# Patient Record
Sex: Female | Born: 1955
Health system: Southern US, Community
[De-identification: ages and names within clinical notes are randomized; demographics above are authoritative.]

## PROBLEM LIST (undated history)

## (undated) DIAGNOSIS — I10 Essential (primary) hypertension: Secondary | ICD-10-CM

## (undated) DIAGNOSIS — F419 Anxiety disorder, unspecified: Secondary | ICD-10-CM

## (undated) DIAGNOSIS — M797 Fibromyalgia: Secondary | ICD-10-CM

## (undated) DIAGNOSIS — M3313 Other dermatomyositis without myopathy: Secondary | ICD-10-CM

## (undated) DIAGNOSIS — R51 Headache: Secondary | ICD-10-CM

## (undated) DIAGNOSIS — E785 Hyperlipidemia, unspecified: Secondary | ICD-10-CM

## (undated) DIAGNOSIS — M1990 Unspecified osteoarthritis, unspecified site: Secondary | ICD-10-CM

## (undated) DIAGNOSIS — M3390 Dermatopolymyositis, unspecified, organ involvement unspecified: Secondary | ICD-10-CM

## (undated) DIAGNOSIS — M3500 Sicca syndrome, unspecified: Secondary | ICD-10-CM

## (undated) DIAGNOSIS — Z720 Tobacco use: Secondary | ICD-10-CM

## (undated) DIAGNOSIS — N6019 Diffuse cystic mastopathy of unspecified breast: Secondary | ICD-10-CM

## (undated) DIAGNOSIS — R011 Cardiac murmur, unspecified: Secondary | ICD-10-CM

## (undated) HISTORY — DX: Anxiety disorder, unspecified: F41.9

## (undated) HISTORY — PX: OTHER SURGICAL HISTORY: SHX169

## (undated) HISTORY — PX: OOPHORECTOMY: SHX86

## (undated) HISTORY — PX: TONSILLECTOMY: SUR1361

## (undated) HISTORY — DX: Tobacco use: Z72.0

## (undated) HISTORY — DX: Fibromyalgia: M79.7

## (undated) HISTORY — PX: ABDOMINAL HYSTERECTOMY: SHX81

## (undated) HISTORY — PX: TUBAL LIGATION: SHX77

## (undated) HISTORY — DX: Diffuse cystic mastopathy of unspecified breast: N60.19

## (undated) HISTORY — DX: Hyperlipidemia, unspecified: E78.5

## (undated) HISTORY — PX: APPENDECTOMY: SHX54

---

## 2004-02-17 ENCOUNTER — Ambulatory Visit: Payer: Self-pay | Admitting: Unknown Physician Specialty

## 2004-06-22 ENCOUNTER — Ambulatory Visit: Payer: Self-pay | Admitting: Unknown Physician Specialty

## 2004-08-14 ENCOUNTER — Encounter: Admission: RE | Admit: 2004-08-14 | Discharge: 2004-08-14 | Payer: Self-pay | Admitting: Sports Medicine

## 2005-01-09 ENCOUNTER — Ambulatory Visit: Payer: Self-pay | Admitting: Internal Medicine

## 2005-04-17 ENCOUNTER — Ambulatory Visit: Payer: Self-pay | Admitting: Unknown Physician Specialty

## 2005-11-07 ENCOUNTER — Ambulatory Visit: Payer: Self-pay | Admitting: Internal Medicine

## 2006-03-15 ENCOUNTER — Ambulatory Visit: Payer: Self-pay | Admitting: Internal Medicine

## 2006-10-24 ENCOUNTER — Telehealth: Payer: Self-pay | Admitting: Internal Medicine

## 2007-01-21 ENCOUNTER — Ambulatory Visit: Payer: Self-pay | Admitting: Internal Medicine

## 2007-03-12 ENCOUNTER — Ambulatory Visit: Payer: Self-pay | Admitting: Internal Medicine

## 2008-04-23 ENCOUNTER — Ambulatory Visit: Payer: Self-pay | Admitting: Internal Medicine

## 2009-04-07 ENCOUNTER — Ambulatory Visit: Payer: Self-pay | Admitting: Internal Medicine

## 2010-03-12 ENCOUNTER — Encounter: Payer: Self-pay | Admitting: Sports Medicine

## 2010-06-01 ENCOUNTER — Ambulatory Visit: Payer: Self-pay | Admitting: Internal Medicine

## 2010-07-04 LAB — HM MAMMOGRAPHY: HM Mammogram: NORMAL

## 2010-11-10 ENCOUNTER — Other Ambulatory Visit: Payer: Self-pay | Admitting: Internal Medicine

## 2010-11-11 MED ORDER — DIAZEPAM 5 MG PO TABS: 5.0000 mg | ORAL_TABLET | Freq: Two times a day (BID) | ORAL | Status: DC | PRN

## 2010-12-22 ENCOUNTER — Telehealth: Payer: Self-pay | Admitting: Internal Medicine

## 2010-12-22 MED ORDER — AZITHROMYCIN 500 MG PO TABS: 500.0000 mg | ORAL_TABLET | Freq: Every day | ORAL | Status: AC

## 2010-12-22 MED ORDER — BENZONATATE 200 MG PO CAPS: 200.0000 mg | ORAL_CAPSULE | Freq: Four times a day (QID) | ORAL | Status: AC | PRN

## 2010-12-22 NOTE — Telephone Encounter (Signed)
Plesae call her in tessalon 200 mg one tablet every 6 hours prn cough .#60. Also azithroymcing 500 mg one tablet daily  #7 no refills.  Please alsk urge her to make appt and get records transferred.   If she asks for something stronger, my policy is she will  need to be seen.

## 2010-12-22 NOTE — Telephone Encounter (Signed)
[phone] Pt called wanted to know if you could call her something in cough,sore throat,glands swollen [facility] [id number]  [phone]  Patient has not signed medical release form

## 2010-12-22 NOTE — Telephone Encounter (Signed)
Both Rxs have been made and she made an appt, and will get her records transferred prior to the appt.

## 2011-01-03 ENCOUNTER — Ambulatory Visit (INDEPENDENT_AMBULATORY_CARE_PROVIDER_SITE_OTHER): Payer: 59 | Admitting: Internal Medicine

## 2011-01-03 ENCOUNTER — Encounter: Payer: Self-pay | Admitting: Internal Medicine

## 2011-01-03 VITALS — BP 156/70 | HR 70 | Temp 98.3°F | Resp 16 | Ht 65.0 in | Wt 138.5 lb

## 2011-01-03 DIAGNOSIS — Z7189 Other specified counseling: Secondary | ICD-10-CM

## 2011-01-03 DIAGNOSIS — J449 Chronic obstructive pulmonary disease, unspecified: Secondary | ICD-10-CM

## 2011-01-03 DIAGNOSIS — G4440 Drug-induced headache, not elsewhere classified, not intractable: Secondary | ICD-10-CM

## 2011-01-03 DIAGNOSIS — Z720 Tobacco use: Secondary | ICD-10-CM

## 2011-01-03 DIAGNOSIS — M797 Fibromyalgia: Secondary | ICD-10-CM

## 2011-01-03 DIAGNOSIS — IMO0001 Reserved for inherently not codable concepts without codable children: Secondary | ICD-10-CM

## 2011-01-03 DIAGNOSIS — R030 Elevated blood-pressure reading, without diagnosis of hypertension: Secondary | ICD-10-CM

## 2011-01-03 DIAGNOSIS — Z716 Tobacco abuse counseling: Secondary | ICD-10-CM

## 2011-01-03 DIAGNOSIS — R05 Cough: Secondary | ICD-10-CM

## 2011-01-03 DIAGNOSIS — F17200 Nicotine dependence, unspecified, uncomplicated: Secondary | ICD-10-CM

## 2011-01-03 DIAGNOSIS — F411 Generalized anxiety disorder: Secondary | ICD-10-CM

## 2011-01-03 DIAGNOSIS — F419 Anxiety disorder, unspecified: Secondary | ICD-10-CM

## 2011-01-03 MED ORDER — HYDROCODONE-ACETAMINOPHEN 7.5-500 MG PO TABS: 1.0000 | ORAL_TABLET | Freq: Four times a day (QID) | ORAL | Status: AC | PRN

## 2011-01-03 MED ORDER — ALPRAZOLAM 0.5 MG PO TABS: 0.5000 mg | ORAL_TABLET | Freq: Two times a day (BID) | ORAL | Status: DC | PRN

## 2011-01-03 NOTE — Progress Notes (Signed)
  Subjective:    Patient ID: Katrina Martinez, female    DOB: 14-Dec-1955, 55 y.o.   MRN: [id number]  HPI   55 yo white female history of chronic bronchitis, ongoing tobacco abuse,  Lifelong, presents  with 2.5 wk history of otitis and productive cough.  She was treated with  a 7 day course of antibiotics and tessalon for cough (tussionex was requested but refused given the ongoing tobacco use and frequent requests for cough supression) .  Finished the antibiotics last week .  Today she is reporting pain under her left scapula aggravated by  Cough and twisting of torso.  The pain is severe and she reports that the cough is not relieved with tessalon because it caused nausea.  She has not stopped smoking despite numerous discussions over the past two years about the risks.  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      Review of Systems  HENT: Positive for postnasal drip and sinus pressure.   Respiratory: Positive for cough and chest tightness.   Neurological: Positive for headaches.  Psychiatric/Behavioral: Positive for dysphoric mood. The patient is nervous/anxious.   All other systems reviewed and are negative.       Objective:   Physical Exam  Constitutional: She is oriented to person, place, and time. Vital signs are normal. She appears well-developed and well-nourished.  HENT:  Mouth/Throat: Oropharynx is clear and moist.  Eyes: EOM are normal. Pupils are equal, round, and reactive to light. No scleral icterus.  Neck: Normal range of motion. Neck supple. No JVD present. No thyromegaly present.  Cardiovascular: Normal rate, regular rhythm, normal heart sounds and intact distal pulses.   Pulmonary/Chest: Effort normal and breath sounds normal.  Abdominal: Soft. Bowel sounds are normal. She exhibits no mass.  There is no tenderness.  Musculoskeletal: Normal range of motion. She exhibits no edema.  Lymphadenopathy:    She has no cervical adenopathy.  Neurological: She is alert and oriented to person, place, and time.  Skin: Skin is warm and dry.  Psychiatric: She has a normal mood and affect.          Assessment & Plan:  Bronchitis:  Due to persistent posterior chest pain, 2 view was done to rule out PNA,  CXR was normal. Continue symptomatic treatment and tobacco cessation again discussed and advised.

## 2011-01-04 ENCOUNTER — Ambulatory Visit (INDEPENDENT_AMBULATORY_CARE_PROVIDER_SITE_OTHER)
Admission: RE | Admit: 2011-01-04 | Discharge: 2011-01-04 | Disposition: A | Discharge status: Home / Self Care | Payer: 59 | Source: Ambulatory Visit | Attending: Internal Medicine | Admitting: Internal Medicine

## 2011-01-04 DIAGNOSIS — R05 Cough: Secondary | ICD-10-CM

## 2011-01-04 DIAGNOSIS — R059 Cough, unspecified: Secondary | ICD-10-CM

## 2011-01-06 ENCOUNTER — Encounter: Payer: Self-pay | Admitting: Internal Medicine

## 2011-01-06 DIAGNOSIS — F419 Anxiety disorder, unspecified: Secondary | ICD-10-CM | POA: Insufficient documentation

## 2011-01-06 DIAGNOSIS — Z87891 Personal history of nicotine dependence: Secondary | ICD-10-CM | POA: Insufficient documentation

## 2011-01-06 DIAGNOSIS — N6019 Diffuse cystic mastopathy of unspecified breast: Secondary | ICD-10-CM | POA: Insufficient documentation

## 2011-01-06 DIAGNOSIS — J449 Chronic obstructive pulmonary disease, unspecified: Secondary | ICD-10-CM | POA: Insufficient documentation

## 2011-01-06 DIAGNOSIS — M797 Fibromyalgia: Secondary | ICD-10-CM | POA: Insufficient documentation

## 2011-01-06 DIAGNOSIS — Z716 Tobacco abuse counseling: Secondary | ICD-10-CM | POA: Insufficient documentation

## 2011-01-06 DIAGNOSIS — G4440 Drug-induced headache, not elsewhere classified, not intractable: Secondary | ICD-10-CM | POA: Insufficient documentation

## 2011-01-06 NOTE — Patient Instructions (Signed)
Please consider tobacco cessation to reduce your occurrences of respiratory infections and your risks of lung and breast cancer.   If the electric cigarette does not help ,  We will discuss a trial of wellbutrin at next visit.

## 2011-01-06 NOTE — Assessment & Plan Note (Signed)
She has been normotensive upon reviw of last year of office notes.  Will recheck in one month

## 2011-01-06 NOTE — Assessment & Plan Note (Signed)
She was inquiring about the electric cigarette , which I encouraged her to try, since she did not try Chantix in May after another visit for sinusitis/bronchitis.  If she is still smoking in one month will advise trial of wellbutrin. 5 minutes spent discussing her tobacco dependence and her general health decline due to the effects of continued tobacco abuse.

## 2011-01-06 NOTE — Assessment & Plan Note (Signed)
Chronic.  In April I initiated trial of lexapro , which she stated did not help her anxiety.  She continues to prefer benzodiazepines to SSRIs.  We will discuss  tirial of wellbutrin at next visit for dual purpsoe of tobacco cessation and depression/anxiety

## 2011-01-19 ENCOUNTER — Other Ambulatory Visit: Payer: Self-pay | Admitting: Internal Medicine

## 2011-01-23 ENCOUNTER — Telehealth: Payer: Self-pay | Admitting: Internal Medicine

## 2011-01-23 MED ORDER — HYDROCODONE-ACETAMINOPHEN 5-325 MG PO TABS: 1.0000 | ORAL_TABLET | ORAL | Status: AC | PRN

## 2011-01-23 NOTE — Telephone Encounter (Signed)
I will refill the hydrodocone one more time but if she is continuing to need it after this sh e will need to see a pulmonologist. I will be happy to refer her . Qty #  40 no refills.

## 2011-01-23 NOTE — Telephone Encounter (Signed)
[phone]  Pt has appoinment on 02/02/11 and she is out of her cough med genric for hydrocodine  Pt stated she is doing better with not smoking [facility] [employer].

## 2011-01-23 NOTE — Telephone Encounter (Signed)
Can patient have a refill, it is not in her chart.  Please advise.

## 2011-01-24 NOTE — Telephone Encounter (Signed)
Patient notified, Rx has been called in.  She stated she will let us know if she needs the referral because she is still coughing.

## 2011-02-02 ENCOUNTER — Encounter: Payer: Self-pay | Admitting: Internal Medicine

## 2011-02-02 ENCOUNTER — Ambulatory Visit (INDEPENDENT_AMBULATORY_CARE_PROVIDER_SITE_OTHER): Payer: 59 | Admitting: Internal Medicine

## 2011-02-02 DIAGNOSIS — Z720 Tobacco use: Secondary | ICD-10-CM

## 2011-02-02 DIAGNOSIS — F419 Anxiety disorder, unspecified: Secondary | ICD-10-CM

## 2011-02-02 DIAGNOSIS — J449 Chronic obstructive pulmonary disease, unspecified: Secondary | ICD-10-CM

## 2011-02-02 DIAGNOSIS — R030 Elevated blood-pressure reading, without diagnosis of hypertension: Secondary | ICD-10-CM

## 2011-02-02 DIAGNOSIS — L299 Pruritus, unspecified: Secondary | ICD-10-CM

## 2011-02-02 DIAGNOSIS — F411 Generalized anxiety disorder: Secondary | ICD-10-CM

## 2011-02-02 DIAGNOSIS — R05 Cough: Secondary | ICD-10-CM

## 2011-02-02 DIAGNOSIS — F17200 Nicotine dependence, unspecified, uncomplicated: Secondary | ICD-10-CM

## 2011-02-02 MED ORDER — HYDROXYZINE PAMOATE 100 MG PO CAPS: 100.0000 mg | ORAL_CAPSULE | Freq: Three times a day (TID) | ORAL | Status: AC | PRN

## 2011-02-02 MED ORDER — CICLESONIDE 50 MCG/ACT NA SUSP: 2.0000 | Freq: Every day | NASAL | Status: DC

## 2011-02-02 NOTE — Progress Notes (Signed)
  Subjective:    Patient ID: Katrina Martinez, female    DOB: 1955/08/31, 55 y.o.   MRN: [id number]  HPI  Ms Bachand returns for a  one month followup from COPD exacerbation.  She continues to smoke but has reduced her use to one pack per month.  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  She is not using a sterid inhaler or bronchodilator regularly and had apparently normal PFTS one or two years ago.    Past Medical History  Diagnosis Date  . Tobacco abuse   . Hyperlipidemia   . Fibrocystic breast disease in female     annual mammograms Feb 2012 , [place]  . Anxiety   . Fibromyalgia     did not tolerate Cymbalta trial (per records)   Current Outpatient Prescriptions on File Prior to Visit  Medication Sig Dispense Refill  . ALPRAZolam (XANAX) 0.5 MG tablet Take 1 tablet (0.5 mg total) by mouth 2 (two) times daily as needed for sleep or anxiety.  60 tablet  3  . diazepam (VALIUM) 5 MG tablet Take 1 tablet (5 mg total) by mouth 2 (two) times daily as needed for anxiety or sleep.  30 tablet  2  . fexofenadine (ALLEGRA) 180 MG tablet Take 180 mg by mouth daily.        Marland Kitchen HYDROcodone-acetaminophen (NORCO) 5-325 MG per tablet Take 1 tablet by mouth every 4 (four) hours as needed (cough).  40 tablet  0     Review of Systems  Constitutional: Negative for fever, chills and unexpected weight change.  HENT: Positive for postnasal drip. Negative for hearing loss, ear pain, nosebleeds, congestion, sore throat, facial swelling, rhinorrhea, sneezing, mouth sores, trouble swallowing, neck pain, neck stiffness, voice change, sinus pressure, tinnitus and ear discharge.   Eyes: Negative for pain, discharge, redness and visual disturbance.  Respiratory: Positive for cough. Negative for chest tightness, shortness of breath, wheezing and stridor.   Cardiovascular: Negative for chest pain, palpitations and leg  swelling.  Musculoskeletal: Negative for myalgias and arthralgias.  Skin: Negative for color change and rash.  Neurological: Negative for dizziness, weakness, light-headedness and headaches.  Hematological: Negative for adenopathy.  Psychiatric/Behavioral: Negative for dysphoric mood. The patient is nervous/anxious.   All other systems reviewed and are negative.       Objective:   Physical Exam  Constitutional: She is oriented to person, place, and time. She appears well-developed and well-nourished.  HENT:  Mouth/Throat: Oropharynx is clear and moist.  Eyes: EOM are normal. Pupils are equal, round, and reactive to light. No scleral icterus.  Neck: Normal range of motion. Neck supple. No JVD present. No thyromegaly present.  Cardiovascular: Normal rate, regular rhythm, normal heart sounds and intact distal pulses.   Pulmonary/Chest: Effort normal and breath sounds normal.  Abdominal: Soft. Bowel sounds are normal. She exhibits no mass. There is no tenderness.  Musculoskeletal: Normal range of motion. She exhibits no edema.  Lymphadenopathy:    She has no cervical adenopathy.  Neurological: She is alert and oriented to person, place, and time.  Skin: Skin is warm and dry.  Psychiatric: She has a normal mood and affect.          Assessment & Plan:

## 2011-02-02 NOTE — Patient Instructions (Signed)
For your nighttime cough,  please use omnaris spray daily (2 squirts)  And add OTC prilosec for reflux.  You can use vicodin for a week to overlap.  If the cough recurs despite using daily omnaris and prilosec,  Call and we will refer your to ENT.

## 2011-02-03 ENCOUNTER — Encounter: Payer: Self-pay | Admitting: Internal Medicine

## 2011-02-03 NOTE — Assessment & Plan Note (Signed)
Since late adolescence.  She has mild COPD by PFTS done December 2011 and frequent sinus infections and episodes of bronchitis and has finally reduced her consumption.  She does not want pharmacotherapy at this time.

## 2011-02-03 NOTE — Assessment & Plan Note (Signed)
Repeat bp today is normal.

## 2011-02-03 NOTE — Assessment & Plan Note (Addendum)
Secondary to ongoing tobacco abuse.  PFTS were done Dec 2011 and noted mild disease.  Marland Kitchen Appropriate medications have been prescribed but she does not use them regularly and has requested refills of vicodin regularly to treat nighttime cough.  This was addressed today and she was provide samples of omnari ris to add to her bronchodilator therapy.  If symptoms persistetn will refer to ENT and pulmonology.

## 2011-02-03 NOTE — Assessment & Plan Note (Signed)
Discussed the need for SSRI therapy if her use of  Benzodiazepines becomes daily.  She states that she is not using valium or alprazolam on a daily basis. Will consider Zyban at next visit if refill history suggests otherwise.

## 2011-03-13 ENCOUNTER — Telehealth: Payer: Self-pay | Admitting: Internal Medicine

## 2011-03-13 DIAGNOSIS — R05 Cough: Secondary | ICD-10-CM

## 2011-03-13 MED ORDER — BENZONATATE 200 MG PO CAPS: 200.0000 mg | ORAL_CAPSULE | Freq: Three times a day (TID) | ORAL | Status: DC | PRN

## 2011-03-13 MED ORDER — HYDROCODONE-ACETAMINOPHEN 5-500 MG PO TABS: 1.0000 | ORAL_TABLET | Freq: Three times a day (TID) | ORAL | Status: AC | PRN

## 2011-03-13 NOTE — Telephone Encounter (Signed)
Patient wants to talk to Dr. Darrick Huntsman about her sickness she has two grandchildren that are sick and she has to keep them wants something called into the pharmacy.

## 2011-03-13 NOTE — Telephone Encounter (Signed)
I do not come to the phone for this kind of request,.  please route calls like this to [facility] or [place]

## 2011-03-13 NOTE — Telephone Encounter (Signed)
Tessalon 200 mg every 8 hours,  [facility] to [facility]

## 2011-03-13 NOTE — Telephone Encounter (Signed)
Ok, then you can call in generic vicodin   5/500 ,.one eeery 8 hrs prn cough   #20.  Chart updated.

## 2011-03-13 NOTE — Telephone Encounter (Signed)
Pt states she has a cold with cough.  No fever, no colored mucous.  She's taking delsym for cough but needs something stronger to take at night.  She keeps her 2 grand children so it will be difficult for her to come in for visit.  [facility] [address]

## 2011-03-13 NOTE — Telephone Encounter (Signed)
Medicine called to pharmacy, advised pt. 

## 2011-03-13 NOTE — Telephone Encounter (Signed)
Pt says she cant take tesselon, says that gives her nausea.

## 2011-03-27 ENCOUNTER — Encounter: Payer: Self-pay | Admitting: Internal Medicine

## 2011-05-07 ENCOUNTER — Telehealth: Payer: Self-pay | Admitting: Internal Medicine

## 2011-05-07 MED ORDER — ALPRAZOLAM 0.5 MG PO TABS: 0.5000 mg | ORAL_TABLET | Freq: Two times a day (BID) | ORAL | Status: DC | PRN

## 2011-05-07 NOTE — Telephone Encounter (Signed)
Addended by: Duncan Dull on: 05/07/2011 11:22 PM   Modules accepted: Orders

## 2011-05-08 ENCOUNTER — Other Ambulatory Visit: Payer: Self-pay | Admitting: *Deleted

## 2011-05-08 NOTE — Telephone Encounter (Signed)
Rx called in to [facility] - Alprazolam 0.5mg 

## 2011-06-11 ENCOUNTER — Telehealth: Payer: Self-pay | Admitting: *Deleted

## 2011-06-11 NOTE — Telephone Encounter (Signed)
Triage Record Num: [id number] Operator: Alphonsa Overall Patient Name: Katrina Martinez Call Date & Time: 06/08/2011 6:11:20PM Patient Phone: [phone] PCP: Duncan Dull Patient Gender: Female PCP Fax : [phone] Patient DOB: 04-30-1955 Practice Name: [facility] Reason for Call: Caller: Katrina Martinez/Patient; PCP: Duncan Dull; CB#: [phone]; ; ; Call regarding Cough/Congestion Katrina Martinez calling about URI. Onset 05/30/11. Getting worse. Nasal, chest congestion, sinus pressure, cough. Afebrile. Breathing well for pt. Clear sputum.Pt with ongoing worsening s/s x > 1week. Home care advice given. Katrina Martinez aware needs evaluation within 24hours. Protocol(s) Used: Upper Respiratory Infection (URI) Recommended Outcome per Protocol: See Provider within 24 hours Reason for Outcome: Symptoms worsen after 7 days or symptoms do not improve after 14 days of home care Care Advice: ~ Use a cool mist humidifier to moisten air. Be sure to clean according to manufacturer's instructions. ~ Rest until symptoms improve. ~ Consider use of a saline nasal spray per package directions to help relieve nasal congestion. ~ Warm fluids may help, or try a mixture of honey and lemon juice in warm tea. ~ SYMPTOM / CONDITION MANAGEMENT Coughing up mucus or phlegm helps to get rid of an infection. A productive cough should not be stopped. A cough medicine with guaifenesin (Robitussin, Mucinex) can help loosen the mucus. Cough medicine with dextromethorphan (DM) should be avoided. Drinking lots of fluids can help loosen the mucus too, especially warm fluids. ~ Go to the ED if new onset of stiff neck (unable to touch chin to chest), generalized headache, change in mental status, difficulty opening mouth, unable to swallow liquids or signs of dehydration. ~ Most adults need to drink 6-10 eight-ounce glasses (1.2-2.0 liters) of fluids per day unless previously told to limit fluid intake for other medical reasons.  Limit fluids that contain caffeine, sugar or alcohol. Urine will be a very light yellow color when you drink enough fluids. ~ Analgesic/Antipyretic Advice - Acetaminophen: Consider acetaminophen as directed on label or by pharmacist/provider for pain or fever PRECAUTIONS: - Use if there is no history of liver disease, alcoholism, or intake of three or more alcohol drinks per day - Only if approved by provider during pregnancy or when breastfeeding - During pregnancy, acetaminophen should not be taken more than 3 consecutive days without telling provider - Do not exceed recommended dose or frequency ~ Speak with your provider as soon as possible if: - any temperature elevation in a frail elderly or immunocompromised patient (such as diabetes, HIV/AIDS, renal disease, chemotherapy, organ transplant, or chronic steroid use). - pregnant and temperature elevation of 100.5 F (38C) or above. - fever does not respond despite 2 doses of fever reducing medication. - fever responds to home care but persists for 3 days or more. ~ Respiratory Hygiene: - Cover the nose/mouth tightly with a tissue when coughing or sneezing. - Use tissue 1 time and discard in the nearest waste receptacle. - Wash hands with soap and water or alcohol-based hand rub after coming into contact with respiratory secretions and contaminated objects/materials. ~ 06/08/2011 6:27:42PM Page 1 of 2 [id number] [facility] Triage Call Report Patient Name: Katrina Martinez continuation page/s - Alternatively when no tissue is available, cough into the bend of the elbow. - .Avoid touching your eyes, nose or mouth. 04/

## 2011-06-11 NOTE — Telephone Encounter (Signed)
Left message asking patient to call and schedule appt if needed.

## 2011-07-04 ENCOUNTER — Encounter: Payer: Self-pay | Admitting: Internal Medicine

## 2011-07-04 ENCOUNTER — Ambulatory Visit (INDEPENDENT_AMBULATORY_CARE_PROVIDER_SITE_OTHER): Payer: 59 | Admitting: Internal Medicine

## 2011-07-04 VITALS — BP 138/78 | HR 74 | Temp 98.1°F | Resp 16 | Wt 143.0 lb

## 2011-07-04 DIAGNOSIS — Z716 Tobacco abuse counseling: Secondary | ICD-10-CM

## 2011-07-04 DIAGNOSIS — Z7189 Other specified counseling: Secondary | ICD-10-CM

## 2011-07-04 DIAGNOSIS — J449 Chronic obstructive pulmonary disease, unspecified: Secondary | ICD-10-CM

## 2011-07-04 DIAGNOSIS — F411 Generalized anxiety disorder: Secondary | ICD-10-CM

## 2011-07-04 DIAGNOSIS — Z0000 Encounter for general adult medical examination without abnormal findings: Secondary | ICD-10-CM

## 2011-07-04 DIAGNOSIS — F419 Anxiety disorder, unspecified: Secondary | ICD-10-CM

## 2011-07-04 LAB — HM PAP SMEAR

## 2011-07-04 NOTE — Progress Notes (Signed)
Patient ID: RONETTE HANK, female   DOB: 05/11/1955, 56 y.o.   MRN: [id number]  Patient Active Problem List  Diagnoses  . Elevated blood pressure reading without diagnosis of hypertension  . Fibrocystic breast disease in female  . Anxiety  . Fibromyalgia  . Tobacco abuse  . Tobacco abuse counseling  . COPD (chronic obstructive pulmonary disease) with chronic bronchitis  . Fibromyalgia syndrome  . Headache, drug induced    Subjective:  CC:   Chief Complaint  Patient presents with  . Annual Exam    HPI:   DUSTY WAGONER a 56 y.o. female who presents For her annual non-GYN exam.   she has a history of left knee pain secondary to prior injury ,  and her pain is , acute on chronic.   it started about 2 months ago, and she has been evaluated by a orthopedics and is currently wearing a brace .  Scheduled for arthoscopy for a ligament tear ,  Prior MRI. Procedure is planned for June 13th [facility] Conger.  Noted bp was elevated of MD office,  At mother's house it has been normal vs mildly elevated.  Smoking a lot less,  Averaging one cigarette daily,  Has gained weight,  Had a case of bronchitis two weeks ago ., treated at Urgent Care with nebulizer, steroids, inhaler, antibiotic and cough medicine,   symptoms were triggered by allergic rhinitis secondary to pollen  .  Symptoms  finally resolved  after 2.5 weeks.  by her last complaint is that she is continuing to have  a place in mid back near bra strap that is aggravated and pruritic despite use of hydrocortisone cream daily.  it has been present for 3 years.    she does note that when her bras start to become old and worn she starts to have contact dermatitis in areas where the fabric as revealed the underlying elastic.   Past Medical History  Diagnosis Date  . Tobacco abuse   . Hyperlipidemia   . Fibrocystic breast disease in female     annual mammograms Feb 2012 , [place]  . Anxiety   . Fibromyalgia     did not tolerate Cymbalta  trial (per records)    Past Surgical History  Procedure Date  . Oophorectomy     bilateral  . Abdominal hysterectomy   . Monarch sling          The following portions of the patient's history were reviewed and updated as appropriate: Allergies, current medications, and problem list.    Review of Systems:   12 Pt  review of systems was negative except those addressed in the HPI,     History   Social History  . Marital Status: Married    Spouse Name: N/A    Number of Children: N/A  . Years of Education: N/A   Occupational History  . Not on file.   Social History Main Topics  . Smoking status: Current Everyday Smoker    Types: Cigarettes  . Smokeless tobacco: Never Used   Comment: pt has cut back on smoking . she states she smokes one cigarette a day two at the most  . Alcohol Use: No  . Drug Use: No  . Sexually Active: Not on file   Other Topics Concern  . Not on file   Social History Narrative  . No narrative on file    Objective:  BP 138/78  Pulse 74  Temp(Src) 98.1 F (36.7 C) (Oral)  Resp 16  Wt 143 lb (64.864 kg)  SpO2 94%  General appearance: alert, cooperative and appears stated age Ears: normal TM's and external ear canals both ears Throat: lips, mucosa, and tongue normal; teeth and gums normal Neck: no adenopathy, no carotid bruit, supple, symmetrical, trachea midline and thyroid not enlarged, symmetric, no tenderness/mass/nodules Back: symmetric, no curvature. ROM normal. No CVA tenderness. Lungs: clear to auscultation bilaterally Heart: regular rate and rhythm, S1, S2 normal, no murmur, click, rub or gallop Abdomen: soft, non-tender; bowel sounds normal; no masses,  no organomegaly Pulses: 2+ and symmetric Skin: Skin color, texture, turgor normal. No rashes or lesions Lymph nodes: Cervical, supraclavicular, and axillary nodes normal.  Assessment and Plan:  COPD (chronic obstructive pulmonary disease) with chronic bronchitis Her  symptoms are currently resolved. She continues to smoke although she has cut back to one cigarette per day. Encouragement given to cut them out  completely. continue her current regimen.   Anxiety Chronic managed with when necessary alprazolam. Prior trials of Celexa and Zoloft caused excessive sedation.  Tobacco abuse counseling She has actually cut back quite a bit since last visit. She smokes one to 2 cigarettes per day. Encouragement given to quit completely. Risks and benefits of continued tobacco abuse discussed.    Updated Medication List Outpatient Encounter Prescriptions as of 07/04/2011  Medication Sig Dispense Refill  . albuterol (PROVENTIL HFA;VENTOLIN HFA) 108 (90 BASE) MCG/ACT inhaler Inhale 2 puffs into the lungs every 6 (six) hours as needed.      . ALPRAZolam (XANAX) 0.5 MG tablet Take 1 tablet (0.5 mg total) by mouth 2 (two) times daily as needed for sleep or anxiety.  60 tablet  3  . fexofenadine (ALLEGRA) 180 MG tablet Take 180 mg by mouth daily.        Marland Kitchen DISCONTD: ciclesonide (OMNARIS) 50 MCG/ACT nasal spray Place 2 sprays into both nostrils daily.  12.5 g  11  . DISCONTD: diazepam (VALIUM) 5 MG tablet Take 1 tablet (5 mg total) by mouth 2 (two) times daily as needed for anxiety or sleep.  30 tablet  2     Orders Placed This Encounter  Procedures  . HM MAMMOGRAPHY  . HM PAP SMEAR  . HM COLONOSCOPY    No Follow-up on file.

## 2011-07-04 NOTE — Patient Instructions (Signed)
Please go get a new bra,  I believe your old bra is irritating your skin on your back and causing a contact dermatitis. .  Return for fasting lipids when you can.  If the anesthesiologist wants you to have a cardiac evaluation , we will set you up with one of the [facility] cardiologists for an ECHO  I would not treat your blood pressure unless it is consistently > 140

## 2011-07-05 DIAGNOSIS — Z0000 Encounter for general adult medical examination without abnormal findings: Secondary | ICD-10-CM | POA: Insufficient documentation

## 2011-07-05 NOTE — Assessment & Plan Note (Signed)
She has actually cut back quite a bit since last visit. She smokes one to 2 cigarettes per day. Encouragement given to quit completely. Risks and benefits of continued tobacco abuse discussed.

## 2011-07-05 NOTE — Assessment & Plan Note (Addendum)
Chronic managed with when necessary alprazolam. Prior trials of Celexa and Zoloft caused excessive sedation.

## 2011-07-05 NOTE — Assessment & Plan Note (Signed)
Breast exam and pelvic exam were deferred since she has brought her 56 yr old grandson with her.  She is status post total abdominal hysterectomy and bilateral salpingo-oophorectomy and does not require Paps. Mammogram is up-to-date.

## 2011-07-05 NOTE — Assessment & Plan Note (Signed)
Her symptoms are currently resolved. She continues to smoke although she has cut back to one cigarette per day. Encouragement given to cut them out  completely. continue her current regimen.

## 2011-07-30 ENCOUNTER — Encounter (HOSPITAL_BASED_OUTPATIENT_CLINIC_OR_DEPARTMENT_OTHER): Payer: Self-pay | Admitting: *Deleted

## 2011-07-30 NOTE — Progress Notes (Signed)
No labs needed-pt very nervous-can take xanax am if nec with sip water

## 2011-08-01 ENCOUNTER — Telehealth: Payer: Self-pay | Admitting: Internal Medicine

## 2011-08-01 NOTE — H&P (Signed)
  [facility] [address]   [address] [place], [place] [address] [phone] A Division of [facility]  Loreta Ave, M.D.     Robert A. Thurston Hole, M.D.     Lunette Stands, M.D. Eulas Post, M.D.    Buford Dresser, M.D. Estell Harpin, M.D. Ralene Cork, D.O.          Genene Churn. Barry Dienes, PA-C            Kirstin A. Shepperson, PA-C [place], OPA-C   RE: Katrina Martinez, Katrina Martinez                                [id number]      DOB: October 31, 1955 PROGRESS NOTE: 06-19-11 Kellin comes in for evaluation and treatment recommendation for persistent symptoms, left knee.  Seen and evaluated recently by Dr. Farris Has.  History outlined there.  Injury last summer.  Persistent, worsening medial mechanical symptoms since that time.  Occasionally gets some soreness and catching superolateral, but the vast majority of her symptoms are all medial.  Time, rest and anti-inflammatory medication without improvement.  Gets reactive swelling towards the end of the day.  X-rays that I reviewed back from June of 2012 when she first got hurt show minimal degenerative change.  MRI scan for persistent symptoms completed and reviewed, also from June of last year.  At that time she had a small focal partial tear distal vastus lateralis tendon at the musculotendinous junction.  Really minimal symptoms there now.  Medial meniscus tear was also seen.  I have looked at the report and the scan and agree with that.  She also had a prominent effusion which is persisting.  Do not see much in the way of any degenerative changes.   Her entire history is reviewed, updated and included in the chart.  Otherwise in excellent health.  Definitely has enough symptoms to warrant intervention.    EXAMINATION: Her general exam is outlined and included in the chart.  Specifically, she has a 2+ effusion on the left.  Point tender medial joint line.  Positive medial McMurray's.  Stable  ligaments.  Good motion.  No grating.  No crepitus.  I really don't get any soreness over the previous injury over the vastus lateralis musculotendinous junction and I don't think anything needs to be done there.  Extensor mechanism is intact.  Negative log roll both hips.  Negative straight leg raise both sides.  Neurovascularly intact distally.      DISPOSITION:  Left knee medial meniscus tear.  Discussed definitive treatment.  Outcome is going to be based on what we find in regards to the meniscus, as well as other degenerative changes.  Her MRI is almost a year old, but I don't think we need to repeat it, but I am anticipating further tearing of her meniscus that has occurred over the last year.  All questions answered.  Paperwork complete.  More than 25 minutes spent face-to-face covering all of this with her and she understands.    Loreta Ave, M.D.   Electronically verified by Loreta Ave, M.D. DFM:jjh D 06-19-11 T 06-20-11

## 2011-08-01 NOTE — Telephone Encounter (Signed)
ER CALL. Caller: Othel/Patient; PCP: Duncan Dull; CB#: [phone]; ; ; Call regarding Possible Allergic Reaction, Pt was [place] w/ tomato sauce, Left Jaw is swelling, w/ some issues w/ swallowing, feels like a knot in ear but feels it in throat also, onset 6-12.  Consulted w/ Morrie Sheldon, RN at office, ok to send to UC, d/t no appts at office.

## 2011-08-02 ENCOUNTER — Encounter (HOSPITAL_BASED_OUTPATIENT_CLINIC_OR_DEPARTMENT_OTHER)
Admission: RE | Disposition: A | Discharge status: Home / Self Care | Payer: Self-pay | Source: Ambulatory Visit | Attending: Orthopedic Surgery

## 2011-08-02 ENCOUNTER — Encounter (HOSPITAL_BASED_OUTPATIENT_CLINIC_OR_DEPARTMENT_OTHER): Payer: Self-pay | Admitting: Certified Registered"

## 2011-08-02 ENCOUNTER — Ambulatory Visit (HOSPITAL_BASED_OUTPATIENT_CLINIC_OR_DEPARTMENT_OTHER)
Admission: RE | Admit: 2011-08-02 | Discharge: 2011-08-02 | Disposition: A | Discharge status: Home / Self Care | Payer: 59 | Source: Ambulatory Visit | Attending: Orthopedic Surgery | Admitting: Orthopedic Surgery

## 2011-08-02 ENCOUNTER — Encounter (HOSPITAL_BASED_OUTPATIENT_CLINIC_OR_DEPARTMENT_OTHER): Payer: Self-pay | Admitting: Anesthesiology

## 2011-08-02 ENCOUNTER — Ambulatory Visit (HOSPITAL_BASED_OUTPATIENT_CLINIC_OR_DEPARTMENT_OTHER): Payer: 59 | Admitting: Anesthesiology

## 2011-08-02 DIAGNOSIS — M23305 Other meniscus derangements, unspecified medial meniscus, unspecified knee: Secondary | ICD-10-CM | POA: Insufficient documentation

## 2011-08-02 DIAGNOSIS — Z4789 Encounter for other orthopedic aftercare: Secondary | ICD-10-CM

## 2011-08-02 DIAGNOSIS — J4489 Other specified chronic obstructive pulmonary disease: Secondary | ICD-10-CM | POA: Insufficient documentation

## 2011-08-02 DIAGNOSIS — J449 Chronic obstructive pulmonary disease, unspecified: Secondary | ICD-10-CM | POA: Insufficient documentation

## 2011-08-02 HISTORY — DX: Cardiac murmur, unspecified: R01.1

## 2011-08-02 HISTORY — DX: Headache: R51

## 2011-08-02 HISTORY — DX: Unspecified osteoarthritis, unspecified site: M19.90

## 2011-08-02 SURGERY — ARTHROSCOPY, KNEE, WITH MEDIAL MENISCECTOMY
Anesthesia: General | Site: Knee | Laterality: Left | Wound class: Clean

## 2011-08-02 MED ORDER — METOCLOPRAMIDE HCL 5 MG/ML IJ SOLN
INTRAMUSCULAR | Status: DC | PRN
  Administered 2011-08-02: 10 mg via INTRAVENOUS

## 2011-08-02 MED ORDER — OXYCODONE HCL 5 MG PO TABS: 5.0000 mg | ORAL_TABLET | Freq: Once | ORAL | Status: DC | PRN

## 2011-08-02 MED ORDER — METOCLOPRAMIDE HCL 5 MG/ML IJ SOLN: 10.0000 mg | Freq: Once | INTRAMUSCULAR | Status: DC | PRN

## 2011-08-02 MED ORDER — METHYLPREDNISOLONE ACETATE 80 MG/ML IJ SUSP
INTRAMUSCULAR | Status: DC | PRN
  Administered 2011-08-02: 12:00:00 via INTRA_ARTICULAR

## 2011-08-02 MED ORDER — ACETAMINOPHEN 10 MG/ML IV SOLN
1000.0000 mg | Freq: Once | INTRAVENOUS | Status: AC
  Administered 2011-08-02: 1000 mg via INTRAVENOUS

## 2011-08-02 MED ORDER — DEXAMETHASONE SODIUM PHOSPHATE 4 MG/ML IJ SOLN
INTRAMUSCULAR | Status: DC | PRN
  Administered 2011-08-02: 4 mg via INTRAVENOUS

## 2011-08-02 MED ORDER — ONDANSETRON HCL 4 MG/2ML IJ SOLN
INTRAMUSCULAR | Status: DC | PRN
  Administered 2011-08-02: 4 mg via INTRAVENOUS

## 2011-08-02 MED ORDER — HYDROMORPHONE HCL PF 1 MG/ML IJ SOLN
0.2500 mg | INTRAMUSCULAR | Status: DC | PRN
  Administered 2011-08-02 (×3): 0.5 mg via INTRAVENOUS

## 2011-08-02 MED ORDER — SODIUM CHLORIDE 0.9 % IR SOLN
Status: DC | PRN
  Administered 2011-08-02: 3000 mL

## 2011-08-02 MED ORDER — CEFAZOLIN SODIUM 1-5 GM-% IV SOLN
1.0000 g | INTRAVENOUS | Status: AC
  Administered 2011-08-02: 1 g via INTRAVENOUS

## 2011-08-02 MED ORDER — MIDAZOLAM HCL 5 MG/5ML IJ SOLN
INTRAMUSCULAR | Status: DC | PRN
  Administered 2011-08-02: 2 mg via INTRAVENOUS

## 2011-08-02 MED ORDER — LIDOCAINE HCL (CARDIAC) 20 MG/ML IV SOLN
INTRAVENOUS | Status: DC | PRN
  Administered 2011-08-02: 50 mg via INTRAVENOUS

## 2011-08-02 MED ORDER — PROPOFOL 10 MG/ML IV EMUL
INTRAVENOUS | Status: DC | PRN
  Administered 2011-08-02: 250 mg via INTRAVENOUS

## 2011-08-02 MED ORDER — METHYLPREDNISOLONE ACETATE 80 MG/ML IJ SUSP: INTRAMUSCULAR | Status: DC | PRN

## 2011-08-02 MED ORDER — LACTATED RINGERS IV SOLN
INTRAVENOUS | Status: DC
  Administered 2011-08-02 (×2): via INTRAVENOUS

## 2011-08-02 MED ORDER — FENTANYL CITRATE 0.05 MG/ML IJ SOLN
INTRAMUSCULAR | Status: DC | PRN
  Administered 2011-08-02: 100 ug via INTRAVENOUS
  Administered 2011-08-02 (×3): 25 ug via INTRAVENOUS

## 2011-08-02 SURGICAL SUPPLY — 41 items
BANDAGE ELASTIC 6 VELCRO ST LF (GAUZE/BANDAGES/DRESSINGS) ×2 IMPLANT
BLADE CUDA 5.5 (BLADE) IMPLANT
BLADE CUDA GRT WHITE 3.5 (BLADE) IMPLANT
BLADE CUTTER GATOR 3.5 (BLADE) ×2 IMPLANT
BLADE CUTTER MENIS 5.5 (BLADE) IMPLANT
BLADE GREAT WHITE 4.2 (BLADE) ×2 IMPLANT
BUR OVAL 4.0 (BURR) IMPLANT
CANISTER OMNI JUG 16 LITER (MISCELLANEOUS) ×2 IMPLANT
CANISTER SUCTION 2500CC (MISCELLANEOUS) IMPLANT
CLOTH BEACON ORANGE TIMEOUT ST (SAFETY) ×2 IMPLANT
CUTTER MENISCUS  4.2MM (BLADE)
CUTTER MENISCUS 4.2MM (BLADE) IMPLANT
DRAPE ARTHROSCOPY W/POUCH 90 (DRAPES) ×2 IMPLANT
DURAPREP 26ML APPLICATOR (WOUND CARE) ×2 IMPLANT
ELECT MENISCUS 165MM 90D (ELECTRODE) IMPLANT
ELECT REM PT RETURN 9FT ADLT (ELECTROSURGICAL)
ELECTRODE REM PT RTRN 9FT ADLT (ELECTROSURGICAL) IMPLANT
GAUZE XEROFORM 1X8 LF (GAUZE/BANDAGES/DRESSINGS) ×2 IMPLANT
GLOVE BIO SURGEON STRL SZ 6.5 (GLOVE) ×1 IMPLANT
GLOVE BIOGEL PI IND STRL 8 (GLOVE) ×1 IMPLANT
GLOVE BIOGEL PI INDICATOR 8 (GLOVE) ×1
GLOVE INDICATOR 7.0 STRL GRN (GLOVE) ×1 IMPLANT
GLOVE ORTHO TXT STRL SZ7.5 (GLOVE) ×4 IMPLANT
GLOVE SKINSENSE NS SZ6.5 (GLOVE) ×1
GLOVE SKINSENSE NS SZ7.5 (GLOVE) ×2
GLOVE SKINSENSE STRL SZ6.5 (GLOVE) IMPLANT
GLOVE SKINSENSE STRL SZ7.5 (GLOVE) IMPLANT
GOWN BRE IMP PREV XXLGXLNG (GOWN DISPOSABLE) ×2 IMPLANT
GOWN PREVENTION PLUS XLARGE (GOWN DISPOSABLE) ×3 IMPLANT
HOLDER KNEE FOAM BLUE (MISCELLANEOUS) ×2 IMPLANT
KNEE WRAP E Z 3 GEL PACK (MISCELLANEOUS) ×1 IMPLANT
PACK ARTHROSCOPY DSU (CUSTOM PROCEDURE TRAY) ×2 IMPLANT
PACK BASIN DAY SURGERY FS (CUSTOM PROCEDURE TRAY) ×2 IMPLANT
PENCIL BUTTON HOLSTER BLD 10FT (ELECTRODE) IMPLANT
SET ARTHROSCOPY TUBING (MISCELLANEOUS) ×2
SET ARTHROSCOPY TUBING LN (MISCELLANEOUS) ×1 IMPLANT
SPONGE GAUZE 4X4 12PLY (GAUZE/BANDAGES/DRESSINGS) ×4 IMPLANT
SUT ETHILON 3 0 PS 1 (SUTURE) ×2 IMPLANT
SUT VIC AB 3-0 FS2 27 (SUTURE) IMPLANT
TOWEL OR 17X24 6PK STRL BLUE (TOWEL DISPOSABLE) ×3 IMPLANT
WATER STERILE IRR 1000ML POUR (IV SOLUTION) ×2 IMPLANT

## 2011-08-02 NOTE — Op Note (Signed)
Katrina Martinez, Katrina Martinez               ACCOUNT NO.:  [id number]  MEDICAL RECORD NO.:  [id number]  LOCATION:                                 FACILITY:  PHYSICIAN:  Loreta Ave, M.D.      DATE OF BIRTH:  DATE OF PROCEDURE:  08/02/2011 DATE OF DISCHARGE:                              OPERATIVE REPORT   PREOPERATIVE DIAGNOSIS:  Left knee medial meniscus tear.  POSTOPERATIVE DIAGNOSIS:  Left knee medial meniscus tear.  PROCEDURE:  Left knee exam under anesthesia, arthroscopy, partial medial meniscectomy.  Also chondroplasty of medial femoral condyle.  SURGEON:  Loreta Ave, M.D.  ASSISTANT:  Genene Churn. Barry Dienes, [place].  ANESTHESIA:  General.  BLOOD LOSS:  Minimal.  SPECIMENS:  None.  CULTURES:  None.  COMPLICATION:  None.  DRESSINGS:  Soft compressive.  TOURNIQUET:  Not employed.  PROCEDURE:  The patient was brought to the operating room and placed on the operating table in supine position.  After adequate anesthesia had been obtained, leg holder applied.  Leg prepped and draped in usual sterile fashion.  Two portals, one each in medial and lateral parapatellar.  Arthroscope introduced, knee distended and inspected. Fair amount of synovitis especially anteromedially, debrided. Patellofemoral joint looked good, good tracking.  Lateral meniscus lateral compartment cruciate was normal.  Extensive complex tearing of posterior-half of medial meniscus.  Taken down to a stable rim, tapered into remaining meniscus.  Some grade 2, mild grade 3 changes on the condyle juxtaposed to the tear.  Debrided with chondroplasty. Entire knee examined.  Instruments were removed.  Portals injected with Marcaine.  Knee injected with Depo-Medrol and Marcaine.  Anesthesia reversed.  Brought to recovery room.  Tolerated surgery well.  No complications.     Loreta Ave, M.D.     DFM/MEDQ  D:  08/02/2011  T:  08/02/2011  Job:  [id number]

## 2011-08-02 NOTE — Brief Op Note (Signed)
08/02/2011  12:09 PM  PATIENT:  Katrina Martinez  56 y.o. female  PRE-OPERATIVE DIAGNOSIS:  left knee mmt  POST-OPERATIVE DIAGNOSIS:  left knee mmt  PROCEDURE:  Procedure(s) (LRB): KNEE ARTHROSCOPY WITH partial MEDIAL MENISECTOMY (Left)  SURGEON:  Surgeon(s) and Role:    * Loreta Ave, MD - Primary  PHYSICIAN ASSISTANT: Zonia Kief M     ANESTHESIA:   general  EBL:  Total I/O In: 1400 [I.V.:1400] Out: -   COUNTS:  YES  PATIENT DISPOSITION:  PACU - hemodynamically stable.

## 2011-08-02 NOTE — Anesthesia Postprocedure Evaluation (Signed)
Anesthesia Post Note  Patient: Katrina Martinez  Procedure(s) Performed: Procedure(s) (LRB): KNEE ARTHROSCOPY WITH MEDIAL MENISECTOMY (Left)  Anesthesia type: General  Patient location: PACU  Post pain: Pain level controlled  Post assessment: Patient's Cardiovascular Status Stable  Last Vitals:  Filed Vitals:   08/02/11 1230  BP: 160/93  Pulse: 87  Temp:   Resp: 16    Post vital signs: Reviewed and stable  Level of consciousness: alert  Complications: No apparent anesthesia complications

## 2011-08-02 NOTE — Transfer of Care (Signed)
Immediate Anesthesia Transfer of Care Note  Patient: Katrina Martinez  Procedure(s) Performed: Procedure(s) (LRB): KNEE ARTHROSCOPY WITH MEDIAL MENISECTOMY (Left)  Patient Location: PACU  Anesthesia Type: General  Level of Consciousness: awake, alert , oriented and patient cooperative  Airway & Oxygen Therapy: Patient Spontanous Breathing and Patient connected to face mask oxygen  Post-op Assessment: Report given to PACU RN and Post -op Vital signs reviewed and stable  Post vital signs: Reviewed and stable  Complications: No apparent anesthesia complications

## 2011-08-02 NOTE — Interval H&P Note (Signed)
History and Physical Interval Note:  08/02/2011 7:36 AM  Katrina Martinez  has presented today for surgery, with the diagnosis of left knee mmt  The various methods of treatment have been discussed with the patient and family. After consideration of risks, benefits and other options for treatment, the patient has consented to  Procedure(s) (LRB): KNEE ARTHROSCOPY WITH MEDIAL MENISECTOMY (Left) as a surgical intervention .  The patients' history has been reviewed, patient examined, no change in status, stable for surgery.  I have reviewed the patients' chart and labs.  Questions were answered to the patient's satisfaction.     Ashyia Schraeder F

## 2011-08-02 NOTE — Anesthesia Procedure Notes (Signed)
Procedure Name: LMA Insertion Date/Time: 08/02/2011 11:30 AM Performed by: Verlan Friends Pre-anesthesia Checklist: Patient identified, Emergency Drugs available, Suction available, Patient being monitored and Timeout performed Patient Re-evaluated:Patient Re-evaluated prior to inductionOxygen Delivery Method: Circle System Utilized Preoxygenation: Pre-oxygenation with 100% oxygen Intubation Type: IV induction Ventilation: Mask ventilation without difficulty LMA: LMA inserted LMA Size: 4.0 Number of attempts: 1 (atraumatic) Airway Equipment and Method: bite block (Right bite gard used) Placement Confirmation: positive ETCO2 Tube secured with: Tape Dental Injury: Teeth and Oropharynx as per pre-operative assessment  Comments: Patient has maxillary roundhouse bridges and crowns anterior.  Dentition in good condition and untouched when LMA was inserted

## 2011-08-02 NOTE — Discharge Instructions (Signed)

## 2011-08-02 NOTE — Anesthesia Preprocedure Evaluation (Signed)
Anesthesia Evaluation  Patient identified by MRN, date of birth, ID band Patient awake    Reviewed: Allergy & Precautions, H&P , NPO status , Patient's Chart, lab work & pertinent test results, reviewed documented beta blocker date and time   Airway Mallampati: II TM Distance: >3 FB Neck ROM: full    Dental   Pulmonary COPD         Cardiovascular + Valvular Problems/Murmurs     Neuro/Psych  Headaches,  Neuromuscular disease negative psych ROS   GI/Hepatic negative GI ROS, Neg liver ROS,   Endo/Other  negative endocrine ROS  Renal/GU negative Renal ROS  negative genitourinary   Musculoskeletal   Abdominal   Peds  Hematology negative hematology ROS (+)   Anesthesia Other Findings See surgeon's H&P   Reproductive/Obstetrics negative OB ROS                           Anesthesia Physical Anesthesia Plan  ASA: II  Anesthesia Plan: General   Post-op Pain Management:    Induction: Intravenous  Airway Management Planned: LMA  Additional Equipment:   Intra-op Plan:   Post-operative Plan: Extubation in OR  Informed Consent: I have reviewed the patients History and Physical, chart, labs and discussed the procedure including the risks, benefits and alternatives for the proposed anesthesia with the patient or authorized representative who has indicated his/her understanding and acceptance.   Dental Advisory Given  Plan Discussed with: CRNA and Surgeon  Anesthesia Plan Comments:         Anesthesia Quick Evaluation

## 2011-09-21 ENCOUNTER — Other Ambulatory Visit: Payer: Self-pay | Admitting: *Deleted

## 2011-09-21 MED ORDER — ALPRAZOLAM 0.5 MG PO TABS: 0.5000 mg | ORAL_TABLET | Freq: Two times a day (BID) | ORAL | Status: DC | PRN

## 2011-09-21 NOTE — Telephone Encounter (Signed)
Rx called to [facility] pharmacy.

## 2011-09-27 ENCOUNTER — Other Ambulatory Visit: Payer: Self-pay | Admitting: Internal Medicine

## 2011-10-10 ENCOUNTER — Other Ambulatory Visit: Payer: Self-pay | Admitting: Internal Medicine

## 2011-10-10 MED ORDER — ALPRAZOLAM 0.5 MG PO TABS: 0.5000 mg | ORAL_TABLET | Freq: Two times a day (BID) | ORAL | Status: DC | PRN

## 2011-11-27 ENCOUNTER — Encounter: Payer: Self-pay | Admitting: Internal Medicine

## 2011-11-27 ENCOUNTER — Ambulatory Visit (INDEPENDENT_AMBULATORY_CARE_PROVIDER_SITE_OTHER): Payer: 59 | Admitting: Internal Medicine

## 2011-11-27 VITALS — BP 130/82 | HR 102 | Temp 98.2°F | Ht 65.5 in | Wt 143.5 lb

## 2011-11-27 DIAGNOSIS — Z23 Encounter for immunization: Secondary | ICD-10-CM

## 2011-11-27 DIAGNOSIS — J069 Acute upper respiratory infection, unspecified: Secondary | ICD-10-CM

## 2011-11-27 MED ORDER — HYDROCOD POLST-CHLORPHEN POLST 10-8 MG/5ML PO LQCR: 10.0000 mL | Freq: Every evening | ORAL | Status: DC | PRN

## 2011-11-27 MED ORDER — AZITHROMYCIN 500 MG PO TABS: 500.0000 mg | ORAL_TABLET | Freq: Every day | ORAL | Status: DC

## 2011-11-27 MED ORDER — BENZONATATE 200 MG PO CAPS: 200.0000 mg | ORAL_CAPSULE | Freq: Two times a day (BID) | ORAL | Status: DC | PRN

## 2011-11-27 NOTE — Assessment & Plan Note (Signed)
This URI is most likely viral given the failure of mild HEENT  symptoms to resolve after a z pack.  I have explained that in viral URIS, an antibiotic will not help the symptoms and will increase the risk of developing diarrhea.,  Continue oral and nasal decongestants,  Ibuprofen 400 mg and tylenol 650 mq 8 hrs for aches and pains,  And will addtessalon 100 mg every 8 hours prn cough  Add  abx only if symptoms worsen to include fevers, facial pain, purulent sputum./drainage.

## 2011-11-27 NOTE — Progress Notes (Signed)
Patient ID: Katrina Martinez, female   DOB: Feb 25, 1955, 56 y.o.   MRN: [id number]    Patient Active Problem List  Diagnosis  . Elevated blood pressure reading without diagnosis of hypertension  . Fibrocystic breast disease in female  . Anxiety  . Fibromyalgia  . Tobacco abuse  . Tobacco abuse counseling  . COPD (chronic obstructive pulmonary disease) with chronic bronchitis  . Fibromyalgia syndrome  . Headache, drug induced  . Routine general medical examination at a health care facility  . Acute URI    Subjective:  CC:   Chief Complaint  Patient presents with  . Cough    HPI:   Katrina Martinez a 56 y.o. female who presents with a respiratory infection.  She has been coughing for a week.,  Multiple sick contacts.  No fevers,  but having nausea with eating,  Cough productive of clear to white and thick.  No sinus pain .  But ears were aching.  No swollen glands.,  No myalgias.     Past Medical History  Diagnosis Date  . Tobacco abuse   . Hyperlipidemia   . Fibrocystic breast disease in female     annual mammograms Feb 2012 , [place]  . Anxiety   . Fibromyalgia     did not tolerate Cymbalta trial (per records)  . Arthritis   . Headache   . Heart murmur     echo do 8 hr ago-arh    Past Surgical History  Procedure Date  . Oophorectomy     bilateral  . Abdominal hysterectomy   . Monarch sling   . Tonsillectomy   . Appendectomy   . Tubal ligation     The following portions of the patient's history were reviewed and updated as appropriate: Allergies, current medications, and problem list.    Review of Systems:   12 Pt  review of systems was negative except those addressed in the HPI,     History   Social History  . Marital Status: Married    Spouse Name: N/A    Number of Children: N/A  . Years of Education: N/A   Occupational History  . Not on file.   Social History Main Topics  . Smoking status: Current Every Day Smoker -- 0.5 packs/day   Types: Cigarettes  . Smokeless tobacco: Never Used   Comment: pt has cut back on smoking . she states she smokes one cigarette a day two at the most  . Alcohol Use: No  . Drug Use: No  . Sexually Active: Not on file   Other Topics Concern  . Not on file   Social History Narrative  . No narrative on file    Objective:  BP 130/82  Pulse 102  Temp 98.2 F (36.8 C) (Oral)  Ht 5' 5.5" (1.664 m)  Wt 143 lb 8 oz (65.091 kg)  BMI 23.52 kg/m2  SpO2 98%  General appearance: alert, cooperative and appears stated age Ears: normal TM's and external ear canals both ears Throat: lips, mucosa, and tongue normal; teeth and gums normal Neck: no adenopathy, no carotid bruit, supple, symmetrical, trachea midline and thyroid not enlarged, symmetric, no tenderness/mass/nodules Back: symmetric, no curvature. ROM normal. No CVA tenderness. Lungs: clear to auscultation bilaterally Heart: regular rate and rhythm, S1, S2 normal, no murmur, click, rub or gallop Abdomen: soft, non-tender; bowel sounds normal; no masses,  no organomegaly Pulses: 2+ and symmetric Skin: Skin color, texture, turgor normal. No rashes or lesions Lymph nodes:  Cervical, supraclavicular, and axillary nodes normal.  Assessment and Plan:  Acute URI This URI is most likely viral given the failure of mild HEENT  symptoms to resolve after a z pack.  I have explained that in viral URIS, an antibiotic will not help the symptoms and will increase the risk of developing diarrhea.,  Continue oral and nasal decongestants,  Ibuprofen 400 mg and tylenol 650 mq 8 hrs for aches and pains,  And will addtessalon 100 mg every 8 hours prn cough  Add  abx only if symptoms worsen to include fevers, facial pain, purulent sputum./drainage.    Updated Medication List Outpatient Encounter Prescriptions as of 11/27/2011  Medication Sig Dispense Refill  . albuterol (PROVENTIL HFA;VENTOLIN HFA) 108 (90 BASE) MCG/ACT inhaler Inhale 2 puffs into the lungs  every 6 (six) hours as needed.      . ALPRAZolam (XANAX) 0.5 MG tablet Take 1 tablet (0.5 mg total) by mouth 2 (two) times daily as needed for sleep or anxiety.  60 tablet  3  . fexofenadine (ALLEGRA) 180 MG tablet Take 180 mg by mouth daily.        Marland Kitchen azithromycin (ZITHROMAX) 500 MG tablet Take 1 tablet (500 mg total) by mouth daily.  6 tablet  0  . benzonatate (TESSALON) 200 MG capsule Take 1 capsule (200 mg total) by mouth 2 (two) times daily as needed for cough.  60 capsule  1  . chlorpheniramine-HYDROcodone (TUSSIONEX) 10-8 MG/5ML LQCR Take 10 mLs by mouth at bedtime as needed.  180 mL  0  . DISCONTD: chlorpheniramine-HYDROcodone (TUSSIONEX) 10-8 MG/5ML LQCR Take 10 mLs by mouth at bedtime as needed.  180 mL  0     Orders Placed This Encounter  Procedures  . Tdap vaccine greater than or equal to 7yo IM    No Follow-up on file.

## 2011-11-27 NOTE — Patient Instructions (Addendum)
Your URI is currently viral   Take guaifenesin (mucinex brand name)  An dlots of water !! To thin out the mucus.  Add sudafed PE for congestion,  Use tessalon capsules for daytime cough and tussionex for nightttime cough  Add the azithromycin only if symptoms worsen to include fevers, facial pain, or discolored sputum./drainage.

## 2011-12-05 ENCOUNTER — Ambulatory Visit (INDEPENDENT_AMBULATORY_CARE_PROVIDER_SITE_OTHER): Payer: 59 | Admitting: Internal Medicine

## 2011-12-05 DIAGNOSIS — Z23 Encounter for immunization: Secondary | ICD-10-CM

## 2011-12-13 ENCOUNTER — Telehealth: Payer: Self-pay | Admitting: Internal Medicine

## 2011-12-13 MED ORDER — HYDROCOD POLST-CHLORPHEN POLST 10-8 MG/5ML PO LQCR: 10.0000 mL | Freq: Every evening | ORAL | Status: DC | PRN

## 2011-12-13 NOTE — Telephone Encounter (Signed)
See earlier message, ok to refill tussionex for 180 mL but if she requires a further refills she would need to have an appointment to investigate her cough

## 2011-12-13 NOTE — Telephone Encounter (Signed)
Patient needing another refill on her chlorpheniramine-HYDROcodone (TUSSIONEX) 10-8 MG/5ML.

## 2011-12-13 NOTE — Telephone Encounter (Signed)
This is a controlled substance and should not be refilled without good cause.

## 2011-12-14 ENCOUNTER — Other Ambulatory Visit: Payer: Self-pay

## 2011-12-14 NOTE — Telephone Encounter (Signed)
Rx Tussionex faxed to [facility] pharmacy [phone]

## 2011-12-28 ENCOUNTER — Ambulatory Visit (INDEPENDENT_AMBULATORY_CARE_PROVIDER_SITE_OTHER): Payer: 59 | Admitting: Internal Medicine

## 2011-12-28 ENCOUNTER — Encounter: Payer: Self-pay | Admitting: Internal Medicine

## 2011-12-28 VITALS — BP 116/64 | HR 91 | Temp 98.4°F | Resp 12 | Ht 65.0 in | Wt 142.5 lb

## 2011-12-28 DIAGNOSIS — J449 Chronic obstructive pulmonary disease, unspecified: Secondary | ICD-10-CM

## 2011-12-28 DIAGNOSIS — R05 Cough: Secondary | ICD-10-CM

## 2011-12-28 MED ORDER — LEVOFLOXACIN 500 MG PO TABS: 500.0000 mg | ORAL_TABLET | Freq: Every day | ORAL | Status: DC

## 2011-12-28 MED ORDER — HYDROCOD POLST-CHLORPHEN POLST 10-8 MG/5ML PO LQCR: 10.0000 mL | Freq: Every evening | ORAL | Status: DC | PRN

## 2011-12-28 NOTE — Patient Instructions (Addendum)
You do not have a sinus or ear infection.  You do not need to start the antibiotic unless you develop fevers or disolored sputum. (Levaquin )  If you r cough is not resolved by the time you return,  Please go get the chest x ray  YOU NEED TO QUIT SMOKING!  THE COUGH IS NEVER GOING TO RESOLVE.   Use a mucinex/deslym type of cough medicine to help break up the cough

## 2011-12-28 NOTE — Progress Notes (Signed)
Patient ID: Katrina Martinez, female   DOB: 1955-06-05, 56 y.o.   MRN: [id number]  Patient Active Problem List  Diagnosis  . Elevated blood pressure reading without diagnosis of hypertension  . Fibrocystic breast disease in female  . Anxiety  . Fibromyalgia  . Tobacco abuse  . Tobacco abuse counseling  . COPD (chronic obstructive pulmonary disease) with chronic bronchitis  . Fibromyalgia syndrome  . Headache, drug induced  . Routine general medical examination at a health care facility  . Acute URI    Subjective:  CC:   Chief Complaint  Patient presents with  . Cough    HPI:   Katrina Martinez a 56 y.o. female who presents Recurrent episodesof bronchitis. Her symptoms improved after the last round of by the is now resolved. She continues to smoke but has reduced consumption to one or 2 cigarettes per day. Her cough is productive of clear sputum. However she is coughing incessantly. She is leaving for [employer] bus tomorrow and is worried that she will not be able to travel with her current cough. No fevers or chills. No unintentional weight loss. 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   Past Medical History  Diagnosis Date  . Tobacco abuse   . Hyperlipidemia   . Fibrocystic breast disease in female     annual mammograms Feb 2012 , [place]  . Anxiety   . Fibromyalgia     did not tolerate Cymbalta trial (per records)  . Arthritis   . Headache   . Heart murmur     echo do 8 hr ago-arh    Past Surgical History  Procedure Date  . Oophorectomy     bilateral  . Abdominal hysterectomy   . Monarch sling   . Tonsillectomy   . Appendectomy   . Tubal ligation          The following portions of the patient's history were reviewed and updated as appropriate: Allergies, current medications, and problem list.    Review of Systems:   12 Pt  review of systems was negative except those addressed in the HPI,     History    Social History  . Marital Status: Married    Spouse Name: N/A    Number of Children: N/A  . Years of Education: N/A   Occupational History  . Not on file.   Social History Main Topics  . Smoking status: Current Every Day Smoker -- 0.5 packs/day    Types: Cigarettes  . Smokeless tobacco: Never Used     Comment: pt has cut back on smoking . she states she smokes one cigarette a day two at the most  . Alcohol Use: No  . Drug Use: No  . Sexually Active: Not on file   Other Topics Concern  . Not on file   Social History Narrative  . No narrative on file    Objective:  BP 116/64  Pulse 91  Temp 98.4 F (36.9 C) (Oral)  Resp 12  Ht 5\' 5"  (1.651 m)  Wt 142 lb 8 oz (64.638 kg)  BMI 23.71 kg/m2  SpO2 94%  General appearance: alert, cooperative and appears stated age Ears: normal TM's and external ear canals both ears Throat: lips, mucosa, and tongue normal; teeth and gums normal Neck: no adenopathy, no carotid bruit, supple, symmetrical, trachea midline and thyroid not enlarged, symmetric, no tenderness/mass/nodules Back: symmetric, no curvature. ROM normal. No CVA tenderness. Lungs: clear  to auscultation bilaterally Heart: regular rate and rhythm, S1, S2 normal, no murmur, click, rub or gallop Abdomen: soft, non-tender; bowel sounds normal; no masses,  no organomegaly Pulses: 2+ and symmetric Skin: Skin color, texture, turgor normal. No rashes or lesions Lymph nodes: Cervical, supraclavicular, and axillary nodes normal.  Assessment and Plan:  COPD (chronic obstructive pulmonary disease) with chronic bronchitis counselling given  About cessation of smoking. Levaquin x 7 days,   Spriva, symbicort samples for 10 days,  tussionex refilled.  Chest x ray if no improvement in one week.   Updated Medication List Outpatient Encounter Prescriptions as of 12/28/2011  Medication Sig Dispense Refill  . albuterol (PROVENTIL HFA;VENTOLIN HFA) 108 (90 BASE) MCG/ACT inhaler Inhale  2 puffs into the lungs every 6 (six) hours as needed.      . ALPRAZolam (XANAX) 0.5 MG tablet Take 1 tablet (0.5 mg total) by mouth 2 (two) times daily as needed for sleep or anxiety.  60 tablet  3  . chlorpheniramine-HYDROcodone (TUSSIONEX) 10-8 MG/5ML LQCR Take 10 mLs by mouth at bedtime as needed.  180 mL  0  . fexofenadine (ALLEGRA) 180 MG tablet Take 180 mg by mouth daily.        . [DISCONTINUED] azithromycin (ZITHROMAX) 500 MG tablet Take 1 tablet (500 mg total) by mouth daily.  6 tablet  0  . [DISCONTINUED] benzonatate (TESSALON) 200 MG capsule Take 1 capsule (200 mg total) by mouth 2 (two) times daily as needed for cough.  60 capsule  1  . [DISCONTINUED] chlorpheniramine-HYDROcodone (TUSSIONEX) 10-8 MG/5ML LQCR Take 10 mLs by mouth at bedtime as needed.  180 mL  0  . levofloxacin (LEVAQUIN) 500 MG tablet Take 1 tablet (500 mg total) by mouth daily.  7 tablet  0  . [DISCONTINUED] levofloxacin (LEVAQUIN) 500 MG tablet Take 1 tablet (500 mg total) by mouth daily.  7 tablet  0     Orders Placed This Encounter  Procedures  . DG Chest 2 View    No Follow-up on file.

## 2011-12-30 ENCOUNTER — Encounter: Payer: Self-pay | Admitting: Internal Medicine

## 2011-12-30 NOTE — Assessment & Plan Note (Addendum)
counselling given  About cessation of smoking. Levaquin x 7 days,   Spriva, symbicort samples for 10 days,  tussionex refilled.  Chest x ray if no improvement in one week.

## 2012-01-16 ENCOUNTER — Telehealth: Payer: Self-pay | Admitting: Internal Medicine

## 2012-01-16 DIAGNOSIS — R05 Cough: Secondary | ICD-10-CM

## 2012-01-16 DIAGNOSIS — F17200 Nicotine dependence, unspecified, uncomplicated: Secondary | ICD-10-CM

## 2012-01-16 DIAGNOSIS — R053 Chronic cough: Secondary | ICD-10-CM

## 2012-01-16 DIAGNOSIS — J40 Bronchitis, not specified as acute or chronic: Secondary | ICD-10-CM

## 2012-01-16 MED ORDER — HYDROCOD POLST-CHLORPHEN POLST 10-8 MG/5ML PO LQCR: 10.0000 mL | Freq: Every evening | ORAL | Status: DC | PRN

## 2012-01-16 NOTE — Telephone Encounter (Signed)
Pt called stating she is still coughing from her bronctiias.  She is taking allegra to help dry her congestion up.  Pt wanted to know if you could call her some more cough med.  Pt stated she is coughing it is almost making her sick [employer]

## 2012-01-16 NOTE — Telephone Encounter (Signed)
Refill one time only  180 ml.  As long as she is smoking her cough is not going to resolve,  And I will not continue to refill a cough medicine with a narcotic indefinitely.  Needs chest x ray and \  Needs to see pulmonologist.  Referral to Dr. Kendrick Fries in process.   X ray ordered.

## 2012-01-16 NOTE — Telephone Encounter (Signed)
Patient notified as instructed. 

## 2012-02-05 ENCOUNTER — Encounter: Payer: Self-pay | Admitting: Pulmonary Disease

## 2012-02-05 ENCOUNTER — Other Ambulatory Visit: Payer: Self-pay | Admitting: Internal Medicine

## 2012-02-05 ENCOUNTER — Ambulatory Visit (INDEPENDENT_AMBULATORY_CARE_PROVIDER_SITE_OTHER): Payer: 59 | Admitting: Pulmonary Disease

## 2012-02-05 VITALS — BP 114/76 | HR 70 | Temp 97.9°F | Ht 65.0 in | Wt 141.0 lb

## 2012-02-05 DIAGNOSIS — Z720 Tobacco use: Secondary | ICD-10-CM

## 2012-02-05 DIAGNOSIS — R05 Cough: Secondary | ICD-10-CM

## 2012-02-05 DIAGNOSIS — F17200 Nicotine dependence, unspecified, uncomplicated: Secondary | ICD-10-CM

## 2012-02-05 DIAGNOSIS — J411 Mucopurulent chronic bronchitis: Secondary | ICD-10-CM

## 2012-02-05 MED ORDER — MOMETASONE FURO-FORMOTEROL FUM 100-5 MCG/ACT IN AERO: 2.0000 | INHALATION_SPRAY | Freq: Two times a day (BID) | RESPIRATORY_TRACT | Status: DC

## 2012-02-05 MED ORDER — HYDROCOD POLST-CHLORPHEN POLST 10-8 MG/5ML PO LQCR: 5.0000 mL | Freq: Every evening | ORAL | Status: DC | PRN

## 2012-02-05 NOTE — Assessment & Plan Note (Signed)
This is due to her chronic bronchitis, post nasal drip, and ongoing tobacco abuse.  I suspect this would stop if she would quit smoking.  Plan: -add over the counter saline rinses (rather than sprays) and decongestants -QUIT SMOKING -switch to [facility]

## 2012-02-05 NOTE — Patient Instructions (Signed)
Use Neil Med rinses with distilled water at least twice per day using the instructions on the package. 1/2 hour after using the [facility] Med rinse, use Omnaris two puffs in each nostril once per day. In addition to the allegra, use an over the counter decongestant (such as pseudophed or phenylephrine) as needed for the sinus drainage which is contributing to your cough.  Stop taking the Spiriva  Start taking the   If you are not better in 2 weeks, then start taking Prilosec 20mg  oral daily.

## 2012-02-05 NOTE — Assessment & Plan Note (Signed)
We discussed this at length today, as has Dr. Darrick Huntsman on multiple prior visits.  She gets cigarettes for free from her husband's job at a tobacco company in [place].  Amazingly, they also provide her with health insurance.  She does not want to try Chantix or Wellbutrin because of concern of mental health side effects and weight gain, respectively.  She will try using nicotine gum to quit.  She says that the is ready to quit.  We need to discuss lung cancer screening on the next visit.

## 2012-02-05 NOTE — Assessment & Plan Note (Signed)
Based on Katrina Martinez' simple spirometry today in clinic, she does not have COPD.  Clinically, she has chronic bronchitis which is due to her ongoing cigarette use.  Some patients benefit from long acting beta-agonists and inhaled steroids more than inhaled anti-cholinergics, so we'll stop the Spiriva.  The most important thing she can do is quit smoking cigarettes.    Plan: -stop Spiriva -start dulera -QUIT SMOKING

## 2012-02-05 NOTE — Progress Notes (Signed)
Subjective:    Patient ID: Katrina Martinez, female    DOB: 06-29-1955, 56 y.o.   MRN: [id number]  HPI  This is a very pleasant 56 year old female who comes to our clinic today for evaluation of ongoing cough. She said that when she was a child she had recurrent bronchitis though she was never diagnosed with asthma. She started smoking cigarettes when she was 56 years old and has smoked 2 packs of cigarettes daily for the last 42 years. In the last several weeks she has cut back to one cigarette a day. She has had bronchitis at least twice a year since the fourth grade.  This current episode has lasted since the middle of November. It started with sinus congestion which led to cough and sputum production. She produces sputum on a daily basis but in November a change from its baseline color of clear-white to yellow-green.  She has taken 2 rounds of antibiotics and has been started on Spiriva as well as a nasal steroid spray. The sputum has since decreased in amount in his change back to white in color but her cough is still persistent and bothersome. Her sinus congestion is ongoing as is her postnasal drip. She is taking saline rinses for the sinus congestion as well. She is not taking an over-the-counter decongestant though she is taking Allegra.  She also notes some shortness of breath when she exerts herself heavily while taking care of her young grandchildren. She is still capable of playing golf, going fishing, and doing all of her housework without difficulty and without significant shortness of breath.  She does not have significant heartburn symptoms.   Past Medical History  Diagnosis Date  . Tobacco abuse   . Hyperlipidemia   . Fibrocystic breast disease in female     annual mammograms Feb 2012 , [place]  . Anxiety   . Fibromyalgia     did not tolerate Cymbalta trial (per records)  . Arthritis   . Headache   . Heart murmur     echo do 8 hr ago-arh     Family History  Problem  Relation Age of Onset  . Diabetes Mother   . Heart disease Father      History   Social History  . Marital Status: Married    Spouse Name: N/A    Number of Children: N/A  . Years of Education: N/A   Occupational History  . Not on file.   Social History Main Topics  . Smoking status: Current Every Day Smoker -- 0.5 packs/day for 32 years    Types: Cigarettes  . Smokeless tobacco: Never Used     Comment: pt has cut back on smoking . she states she smokes one cigarette a day two at the most-last reviewed 02/05/12  . Alcohol Use: No  . Drug Use: No  . Sexually Active: Not on file   Other Topics Concern  . Not on file   Social History Narrative  . No narrative on file     Allergies  Allergen Reactions  . Benzonatate Nausea Only  . Latex   . Neosporin (Neomycin-Bacitracin Zn-Polymyx)   . Polysporin (Bacitracin-Polymyxin B)   . Hydrogen Peroxide Rash     Outpatient Prescriptions Prior to Visit  Medication Sig Dispense Refill  . albuterol (PROVENTIL HFA;VENTOLIN HFA) 108 (90 BASE) MCG/ACT inhaler Inhale 2 puffs into the lungs every 6 (six) hours as needed.      . fexofenadine (ALLEGRA) 180 MG tablet Take 180  mg by mouth daily.        . [DISCONTINUED] chlorpheniramine-HYDROcodone (TUSSIONEX) 10-8 MG/5ML LQCR Take 10 mLs by mouth at bedtime as needed.  180 mL  0  . [DISCONTINUED] levofloxacin (LEVAQUIN) 500 MG tablet Take 1 tablet (500 mg total) by mouth daily.  7 tablet  0   Last reviewed on 02/05/2012  2:07 PM by Christen Butter, CMA    Review of Systems  Constitutional: Negative for fever, chills and unexpected weight change.  HENT: Positive for ear pain, congestion, sneezing, postnasal drip and sinus pressure. Negative for nosebleeds, sore throat, rhinorrhea, trouble swallowing, dental problem and voice change.   Eyes: Negative for visual disturbance.  Respiratory: Positive for cough. Negative for choking and shortness of breath.   Cardiovascular: Negative for chest  pain and leg swelling.  Gastrointestinal: Positive for diarrhea. Negative for vomiting and abdominal pain.  Genitourinary: Negative for difficulty urinating.  Musculoskeletal: Positive for arthralgias.  Skin: Negative for rash.  Neurological: Positive for headaches. Negative for tremors and syncope.  Hematological: Does not bruise/bleed easily.       Objective:   Physical Exam  Filed Vitals:   02/05/12 1412  BP: 114/76  Pulse: 70  Temp: 97.9 F (36.6 C)  TempSrc: Oral  Height: 5\' 5"  (1.651 m)  Weight: 141 lb (63.957 kg)  SpO2: 96%   Gen: well appearing, no acute distress HEENT: NCAT, PERRL, EOMi, OP clear, neck supple without masses PULM: Few wheezes bilaterally CV: RRR, no mgr, no JVD AB: BS+, soft, nontender, no hsm Ext: warm, no edema, no clubbing, no cyanosis Derm: no rash or skin breakdown Neuro: A&Ox4, CN II-XII intact, strength 5/5 in all 4 extremities   02/05/2012 Simple spirometry>> normal    Assessment & Plan:   Chronic bronchitis, mucopurulent Based on Katrina Martinez' simple spirometry today in clinic, she does not have COPD.  Clinically, she has chronic bronchitis which is due to her ongoing cigarette use.  Some patients benefit from long acting beta-agonists and inhaled steroids more than inhaled anti-cholinergics, so we'll stop the Spiriva.  The most important thing she can do is quit smoking cigarettes.    Plan: -stop Spiriva -start dulera -QUIT SMOKING  Cough This is due to her chronic bronchitis, post nasal drip, and ongoing tobacco abuse.  I suspect this would stop if she would quit smoking.  Plan: -add over the counter saline rinses (rather than sprays) and decongestants -QUIT SMOKING -switch to [facility]  Tobacco abuse We discussed this at length today, as has Dr. Darrick Huntsman on multiple prior visits.  She gets cigarettes for free from her husband's job at a tobacco company in [place].  Amazingly, they also provide her with health insurance.  She does  not want to try Chantix or Wellbutrin because of concern of mental health side effects and weight gain, respectively.  She will try using nicotine gum to quit.  She says that the is ready to quit.  We need to discuss lung cancer screening on the next visit.   Updated Medication List Outpatient Encounter Prescriptions as of 02/05/2012  Medication Sig Dispense Refill  . albuterol (PROVENTIL HFA;VENTOLIN HFA) 108 (90 BASE) MCG/ACT inhaler Inhale 2 puffs into the lungs every 6 (six) hours as needed.      . ALPRAZolam (XANAX) 0.5 MG tablet TAKE 1 TABLET TWICE A DAY AS NEEDED  60 tablet  3  . ciclesonide (OMNARIS) 50 MCG/ACT nasal spray Place 2 sprays into both nostrils daily. As needed      .  fexofenadine (ALLEGRA) 180 MG tablet Take 180 mg by mouth daily.        . sodium chloride (OCEAN) 0.65 % nasal spray Place 1 spray into the nose as needed.      . [DISCONTINUED] tiotropium (SPIRIVA) 18 MCG inhalation capsule Place 18 mcg into inhaler and inhale daily.      . chlorpheniramine-HYDROcodone (TUSSIONEX PENNKINETIC ER) 10-8 MG/5ML LQCR Take 5 mLs by mouth at bedtime as needed.  140 mL  0  . mometasone-formoterol (DULERA) 100-5 MCG/ACT AERO Inhale 2 puffs into the lungs 2 (two) times daily.  1 Inhaler  2  . [DISCONTINUED] chlorpheniramine-HYDROcodone (TUSSIONEX) 10-8 MG/5ML LQCR Take 10 mLs by mouth at bedtime as needed.  180 mL  0  . [DISCONTINUED] levofloxacin (LEVAQUIN) 500 MG tablet Take 1 tablet (500 mg total) by mouth daily.  7 tablet  0

## 2012-02-21 ENCOUNTER — Telehealth: Payer: Self-pay | Admitting: Pulmonary Disease

## 2012-02-21 NOTE — Telephone Encounter (Signed)
Spoke with pt states she is stil  Coughing up clear thick phlegm,not sleeping at night.  Has not started the prilosec yet getting that today. Pt is requesting rx for  Chlorpheniramine-hydrocodone until she sees you on  03-03-12. Allergies  Allergen Reactions  . Benzonatate Nausea Only  . Latex   . Neosporin (Neomycin-Bacitracin Zn-Polymyx)   . Polysporin (Bacitracin-Polymyxin B)   . Hydrogen Peroxide Rash   Dr Kendrick Fries please advise  Thank You

## 2012-02-22 MED ORDER — HYDROCOD POLST-CHLORPHEN POLST 10-8 MG/5ML PO LQCR: 5.0000 mL | Freq: Every evening | ORAL | Status: DC | PRN

## 2012-02-22 NOTE — Telephone Encounter (Signed)
Pt saw BQ 12.17.13 for consult for cough and chronic bronchitis: Patient Instructions     Use Neil Med rinses with distilled water at least twice per day using the instructions on the package.  1/2 hour after using the [facility] Med rinse, use Omnaris two puffs in each nostril once per day.  In addition to the allegra, use an over the counter decongestant (such as pseudophed or phenylephrine) as needed for the sinus drainage which is contributing to your cough.  Stop taking the Spiriva  Start taking the  If you are not better in 2 weeks, then start taking Prilosec 20mg  oral daily.  ============================================================================ Was also rx'd tussionex #166mL at ov.  BQ was night float yesterday and is off today.  Will forward to doc of the day for tusisonex refill.  Dr Vassie Loll please advise, thanks.

## 2012-02-22 NOTE — Telephone Encounter (Signed)
Rx was called to pharm Pt advised and is aware of 03/03/12 ov with BQ Nothing further needed

## 2012-02-22 NOTE — Telephone Encounter (Signed)
Ok for refill  x1 until FU OV

## 2012-03-03 ENCOUNTER — Ambulatory Visit (INDEPENDENT_AMBULATORY_CARE_PROVIDER_SITE_OTHER): Payer: 59 | Admitting: Pulmonary Disease

## 2012-03-03 ENCOUNTER — Encounter: Payer: Self-pay | Admitting: Pulmonary Disease

## 2012-03-03 VITALS — BP 138/78 | HR 79 | Temp 98.3°F | Ht 65.0 in | Wt 138.0 lb

## 2012-03-03 DIAGNOSIS — Z716 Tobacco abuse counseling: Secondary | ICD-10-CM

## 2012-03-03 DIAGNOSIS — J411 Mucopurulent chronic bronchitis: Secondary | ICD-10-CM

## 2012-03-03 DIAGNOSIS — Z7189 Other specified counseling: Secondary | ICD-10-CM

## 2012-03-03 DIAGNOSIS — R05 Cough: Secondary | ICD-10-CM

## 2012-03-03 DIAGNOSIS — F17200 Nicotine dependence, unspecified, uncomplicated: Secondary | ICD-10-CM

## 2012-03-03 MED ORDER — AZELASTINE HCL 0.1 % NA SOLN: 2.0000 | Freq: Two times a day (BID) | NASAL | Status: DC

## 2012-03-03 MED ORDER — GUAIFENESIN-CODEINE 100-10 MG/5ML PO SYRP: 10.0000 mL | ORAL_SOLUTION | Freq: Three times a day (TID) | ORAL | Status: DC | PRN

## 2012-03-03 MED ORDER — MOMETASONE FURO-FORMOTEROL FUM 200-5 MCG/ACT IN AERO: 2.0000 | INHALATION_SPRAY | Freq: Two times a day (BID) | RESPIRATORY_TRACT | Status: DC

## 2012-03-03 NOTE — Patient Instructions (Signed)
Start taking the increased dose of Dulera (200-5), 2 puffs twice a day Start using the astelin IN ADDITION TO your allegra, phenylephrine, neil med rinses, and omnaris Take the codeine 1-2 tablets every 6 hours as needed for cough; do not take this and drive as it can make you drowsy Try to break your cough with the codeine and using hard candies like jolly ranchers or Hall's; after three days of no cough back off on the codeine  Keep using the prilosec and follow the reflux lifestyle modification instructions we gave you  If you are not improved in 2-3 weeks call us so we can set up a CT scan of your sinuses and an ENT referral  We will see you back in 3-4 weeks or sooner if needed

## 2012-03-03 NOTE — Assessment & Plan Note (Addendum)
This is not going to stop if she keeps smoking.  We really need to break the cough cycle here.  I think that sinus congestion and asthma vs chronic bronchitis are contributing.   To sort out if she has asthma (possible given "lifetime" of recurrent bronchitis) she would need a metacholine challenge test.  However with ongoing smoking this would be a waste of money.  Plan: -robitussin with codeine to help break the cough cycle (we need to be careful here, I question whether or not there is some dependence as she called the office several times afterwards trying to negotiate for Tussionex instead) -double steroid component in Dulera to 200/5 -increase saline rinses -add astelin -continue nasal steroid -GERD diet, prilosec -repeat CXR

## 2012-03-03 NOTE — Assessment & Plan Note (Signed)
See discussion above 

## 2012-03-03 NOTE — Progress Notes (Signed)
Subjective:    Patient ID: Katrina Martinez, female    DOB: 1955/06/05, 57 y.o.   MRN: [id number]  Synopsis: Katrina Martinez first came to the [facility] pulmonary clinic in December 2013 for evaluation of cough in the setting of known chronic bronchitis and ongoing tobacco use. She has smoked one pack of cigarettes daily for 40 years and continues to smoke. Simple spirometry performed in the summer 2013 was normal.  HPI  03/03/2012 routine office visit-- Since her last visit Katrina Martinez is not any better. Infection claims that her cough may be a little but worse. She's having significant sinus drainage which is clear and she coughs up clear sputum throughout the day. She takes it doesn't exit night which helps her rest and does calm the cough down so that she can get some sleep. However as soon as she wakes up in the morning she wakes up with significant mucus production. She's not had chest pain or fevers or chills. She states that she is only smoked 5 cigarettes since last visit. She feels like the [facility] helped initially but now her symptoms have recurred. She has been taking the Prilosec which he says helped with her upset stomach and she no longer has reflux.  Past Medical History  Diagnosis Date  . Tobacco abuse   . Hyperlipidemia   . Fibrocystic breast disease in female     annual mammograms Feb 2012 , [place]  . Anxiety   . Fibromyalgia     did not tolerate Cymbalta trial (per records)  . Arthritis   . Headache   . Heart murmur     echo do 8 hr ago-arh     Review of Systems  Constitutional: Positive for fatigue. Negative for fever and chills.  HENT: Positive for congestion and postnasal drip.   Respiratory: Positive for cough and shortness of breath. Negative for chest tightness and stridor.   Cardiovascular: Negative for chest pain, palpitations and leg swelling.       Objective:   Physical Exam  Filed Vitals:   03/03/12 1012  BP: 138/78  Pulse: 79  Temp: 98.3 F  (36.8 C)  TempSrc: Oral  Height: 5\' 5"  (1.651 m)  Weight: 138 lb (62.596 kg)  SpO2: 99%   Gen: coughing HEENT: NCAT, PERRL, EOMi, nasopharynx inflamed PULM: CTA B CV: RRR, no mgr, no JVD AB: BS+, soft, nontender, no hsm Ext: warm, no edema, no clubbing, no cyanosis  December 2013 simple spirometry normal November 2013 chest x-ray without evidence of lung disease      Assessment & Plan:   Cough This is not going to stop if she keeps smoking.  We really need to break the cough cycle here.  I think that sinus congestion and asthma vs chronic bronchitis are contributing.   To sort out if she has asthma (possible given "lifetime" of recurrent bronchitis) she would need a metacholine challenge test.  However with ongoing smoking this would be a waste of money.  Plan: -robitussin with codeine to help break the cough cycle (we need to be careful here, I question whether or not there is some dependence as she called the office several times afterwards trying to negotiate for Tussionex instead) -double steroid component in Dulera to 200/5 -increase saline rinses -add astelin -continue nasal steroid -GERD diet, prilosec -repeat CXR  Tobacco abuse counseling Discussed again at length.  She knows she needs to quit but refuses.  She claims that she has cut back recently.  Counseling  given.  Chronic bronchitis, mucopurulent See discussion above.   Updated Medication List Outpatient Encounter Prescriptions as of 03/03/2012  Medication Sig Dispense Refill  . albuterol (PROVENTIL HFA;VENTOLIN HFA) 108 (90 BASE) MCG/ACT inhaler Inhale 2 puffs into the lungs every 6 (six) hours as needed.      . ALPRAZolam (XANAX) 0.5 MG tablet TAKE 1 TABLET TWICE A DAY AS NEEDED  60 tablet  3  . chlorpheniramine-HYDROcodone (TUSSIONEX PENNKINETIC ER) 10-8 MG/5ML LQCR Take 5 mLs by mouth at bedtime as needed.  140 mL  0  . ciclesonide (OMNARIS) 50 MCG/ACT nasal spray Place 2 sprays into both nostrils daily.  As needed      . fexofenadine (ALLEGRA) 180 MG tablet Take 180 mg by mouth daily.        . mometasone-formoterol (DULERA) 100-5 MCG/ACT AERO Inhale 2 puffs into the lungs 2 (two) times daily.  1 Inhaler  2  . sodium chloride (OCEAN) 0.65 % nasal spray Place 1 spray into the nose as needed.      Marland Kitchen azelastine (ASTELIN) 137 MCG/SPRAY nasal spray Place 2 sprays into the nose 2 (two) times daily. Use in each nostril as directed  30 mL  2  . guaiFENesin-codeine (ROBITUSSIN AC) 100-10 MG/5ML syrup Take 10 mLs by mouth 3 (three) times daily as needed for cough.  480 mL  0  . mometasone-formoterol (DULERA) 200-5 MCG/ACT AERO Inhale 2 puffs into the lungs 2 (two) times daily.  1 Inhaler  2     w

## 2012-03-03 NOTE — Assessment & Plan Note (Signed)
Discussed again at length.  She knows she needs to quit but refuses.  She claims that she has cut back recently.  Counseling given.

## 2012-03-05 ENCOUNTER — Telehealth: Payer: Self-pay | Admitting: Pulmonary Disease

## 2012-03-05 MED ORDER — HYDROCODONE-HOMATROPINE 5-1.5 MG/5ML PO SYRP: 5.0000 mL | ORAL_SOLUTION | Freq: Four times a day (QID) | ORAL | Status: DC | PRN

## 2012-03-05 NOTE — Telephone Encounter (Signed)
I spoke with pt. She stated the robitussin [facility] is not covered by insurance and Dr. Kendrick Fries was suppose to be calling in a different medication. She stated she went by the [place] office but no one was there and was advised ot give [place] a call. Pt has tussionex on med list but was advised to only take that at night. Pt has tried everything OTC and nothing has worked. She is requesting to have something called in for her cough. Please advise KC thanks  Allergies  Allergen Reactions  . Benzonatate Nausea Only  . Latex   . Neosporin (Neomycin-Bacitracin Zn-Polymyx)   . Polysporin (Bacitracin-Polymyxin B)   . Hydrogen Peroxide Rash

## 2012-03-05 NOTE — Telephone Encounter (Signed)
error 

## 2012-03-05 NOTE — Telephone Encounter (Signed)
Ok per [facility] to send hydromet 5cc q6h prn cough ( #4ounces, no fills). Patient aware. Rx has been phoned into [facility] [address] [address]

## 2012-03-05 NOTE — Telephone Encounter (Signed)
She can have hydromet 5cc q6h prn cough ( #4ounces, no fills).  Let her know that her insurance may not cover any narcotic cough syrups.

## 2012-03-06 ENCOUNTER — Ambulatory Visit (INDEPENDENT_AMBULATORY_CARE_PROVIDER_SITE_OTHER)
Admission: RE | Admit: 2012-03-06 | Discharge: 2012-03-06 | Disposition: A | Discharge status: Home / Self Care | Payer: 59 | Source: Ambulatory Visit | Attending: Pulmonary Disease | Admitting: Pulmonary Disease

## 2012-03-06 DIAGNOSIS — R059 Cough, unspecified: Secondary | ICD-10-CM

## 2012-03-06 DIAGNOSIS — R05 Cough: Secondary | ICD-10-CM

## 2012-03-06 NOTE — Telephone Encounter (Signed)
Phone note was not closed - will do.

## 2012-03-14 ENCOUNTER — Telehealth: Payer: Self-pay | Admitting: Pulmonary Disease

## 2012-03-14 NOTE — Telephone Encounter (Signed)
LMTCB

## 2012-03-17 NOTE — Telephone Encounter (Signed)
Pt states she is doing okay with her daytime cough medication and is really only taking 1/2 tsp at a time. She has ran out of the tussionex for night time and needs a refill on this. Dr. Kendrick Fries, pls advise if this is okay.

## 2012-03-18 NOTE — Telephone Encounter (Signed)
No.  I think there is some drug dependence going on here (her PCP feels the same way). She needs to quit smoking.  She can see [place] again if needed.

## 2012-03-18 NOTE — Telephone Encounter (Signed)
Spoke with patient, made her aware that Dr. Kendrick Fries suggests her quit smoking to try and w these symptoms. Patient reports she has "cut back a whole lot" Reinterated that Dr. Kendrick Fries feels she needs to quit, and if anything else is needed then she can come see [place] again or give [place] a call. Patient verbalized understanding and nothing further needed at this time.

## 2012-03-24 ENCOUNTER — Ambulatory Visit: Payer: 59 | Admitting: Pulmonary Disease

## 2012-04-07 ENCOUNTER — Ambulatory Visit (INDEPENDENT_AMBULATORY_CARE_PROVIDER_SITE_OTHER): Payer: 59 | Admitting: Pulmonary Disease

## 2012-04-07 ENCOUNTER — Encounter: Payer: Self-pay | Admitting: Pulmonary Disease

## 2012-04-07 VITALS — BP 150/82 | HR 98 | Temp 98.2°F | Ht 62.0 in | Wt 142.0 lb

## 2012-04-07 DIAGNOSIS — Z720 Tobacco use: Secondary | ICD-10-CM

## 2012-04-07 DIAGNOSIS — J45909 Unspecified asthma, uncomplicated: Secondary | ICD-10-CM

## 2012-04-07 DIAGNOSIS — J309 Allergic rhinitis, unspecified: Secondary | ICD-10-CM

## 2012-04-07 DIAGNOSIS — R059 Cough, unspecified: Secondary | ICD-10-CM

## 2012-04-07 DIAGNOSIS — R05 Cough: Secondary | ICD-10-CM

## 2012-04-07 DIAGNOSIS — F17200 Nicotine dependence, unspecified, uncomplicated: Secondary | ICD-10-CM

## 2012-04-07 MED ORDER — MOMETASONE FUROATE 220 MCG/INH IN AEPB: 1.0000 | INHALATION_SPRAY | Freq: Every day | RESPIRATORY_TRACT | Status: DC

## 2012-04-07 NOTE — Progress Notes (Signed)
Subjective:    Patient ID: Katrina Martinez, female    DOB: 1955/07/06, 57 y.o.   MRN: [id number]  Synopsis: Katrina Martinez first came to the [facility] pulmonary clinic in December 2013 for evaluation of cough in the setting of known chronic bronchitis and ongoing tobacco use. She has smoked one pack of cigarettes daily for 40 years and continues to smoke. Simple spirometry performed in the summer 2013 was normal.  HPI  03/03/2012 routine office visit-- Since her last visit Katrina Martinez is not any better. Infection claims that her cough may be a little but worse. She's having significant sinus drainage which is clear and she coughs up clear sputum throughout the day. She takes it doesn't exit night which helps her rest and does calm the cough down so that she can get some sleep. However as soon as she wakes up in the morning she wakes up with significant mucus production. She's not had chest pain or fevers or chills. She states that she is only smoked 5 cigarettes since last visit. She feels like the [facility] helped initially but now her symptoms have recurred. She has been taking the Prilosec which he says helped with her upset stomach and she no longer has reflux.  04/07/12 ROV -- Since our last visit Katrina Martinez has been feeling much better. She quit smoking cigarettes in January and her cough has decreased significantly since then. She continues to use altoids and jolly ranchers to help suppress her cough. She states that the inhaler makes her jittery when she uses it but because she feels better she continues to use it. She is not using albuterol. Yesterday she was out side with her grandchildren and she developed sneezing, sinus congestion, and a runny nose.  Again today in clinic despite telling me that she feels better she is requesting a narcotic cough suppressant.   Past Medical History  Diagnosis Date  . Tobacco abuse   . Hyperlipidemia   . Fibrocystic breast disease in female     annual  mammograms Feb 2012 , [place]  . Anxiety   . Fibromyalgia     did not tolerate Cymbalta trial (per records)  . Arthritis   . Headache   . Heart murmur     echo do 8 hr ago-arh     Review of Systems  Constitutional: Negative for fever, chills and fatigue.  HENT: Positive for congestion, rhinorrhea, sneezing and postnasal drip.   Respiratory: Negative for chest tightness and stridor.   Cardiovascular: Negative for palpitations and leg swelling.       Objective:   Physical Exam   Filed Vitals:   04/07/12 1652  BP: 150/82  Pulse: 98  Temp: 98.2 F (36.8 C)  TempSrc: Oral  Height: 5\' 2"  (1.575 m)  Weight: 142 lb (64.411 kg)  SpO2: 99%   Gen:  No acute distress HEENT: NCAT, PERRL, EOMi,  PULM: CTA B CV: RRR, no mgr, no JVD AB: BS+, soft, nontender, no hsm Ext: warm, no edema, no clubbing, no cyanosis  December 2013 simple spirometry normal November 2013 chest x-ray without evidence of lung disease      Assessment & Plan:   Cough She is doing much better since quitting smoking back in January.  The question remains as to whether or not she has asthma as she has been given this diagnosis over the years and she has a lot of allergies.   Plan: -decrease dulera to asmanex (samples given, she will call if it  is working so we can send in an Rx) -metacholine challenge test before next office visit -f/u with [place] in three months  Tobacco abuse I congratulated her on quitting smoking  Allergic rhinitis Plan: -increase frequency of saline rinses -try switching allegra to chlortrimeton -if no better, then Rx singulair     Updated Medication List Outpatient Encounter Prescriptions as of 04/07/2012  Medication Sig Dispense Refill  . albuterol (PROVENTIL HFA;VENTOLIN HFA) 108 (90 BASE) MCG/ACT inhaler Inhale 2 puffs into the lungs every 6 (six) hours as needed.      . ALPRAZolam (XANAX) 0.5 MG tablet TAKE 1 TABLET TWICE A DAY AS NEEDED  60 tablet  3  . azelastine  (ASTELIN) 137 MCG/SPRAY nasal spray Place 2 sprays into the nose 2 (two) times daily. Use in each nostril as directed  30 mL  2  . ciclesonide (OMNARIS) 50 MCG/ACT nasal spray Place 2 sprays into both nostrils daily. As needed      . fexofenadine (ALLEGRA) 180 MG tablet Take 180 mg by mouth daily.        Marland Kitchen guaiFENesin-codeine (ROBITUSSIN AC) 100-10 MG/5ML syrup Take 10 mLs by mouth 3 (three) times daily as needed for cough.  480 mL  0  . mometasone-formoterol (DULERA) 200-5 MCG/ACT AERO Inhale 2 puffs into the lungs 2 (two) times daily.  1 Inhaler  2  . sodium chloride (OCEAN) 0.65 % nasal spray Place 1 spray into the nose as needed.      . [DISCONTINUED] chlorpheniramine-HYDROcodone (TUSSIONEX PENNKINETIC ER) 10-8 MG/5ML LQCR Take 5 mLs by mouth at bedtime as needed.  140 mL  0  . [DISCONTINUED] HYDROcodone-homatropine (HYCODAN) 5-1.5 MG/5ML syrup Take 5 mLs by mouth every 6 (six) hours as needed for cough.  120 mL  0  . [DISCONTINUED] mometasone-formoterol (DULERA) 100-5 MCG/ACT AERO Inhale 2 puffs into the lungs 2 (two) times daily.  1 Inhaler  2   No facility-administered encounter medications on file as of 04/07/2012.     w

## 2012-04-07 NOTE — Assessment & Plan Note (Signed)
Plan: -increase frequency of saline rinses -try switching allegra to chlortrimeton -if no better, then Rx singulair

## 2012-04-07 NOTE — Assessment & Plan Note (Signed)
I congratulated her on quitting smoking. 

## 2012-04-07 NOTE — Patient Instructions (Signed)
Stop the [facility] and try the Asmanex one puff twice a day Substitute the allegra with chlor-trimeton Use the saline rinses 2-3 times a day or more if needed Call us if you need a prescription for the Asmanex or if your sinus symptoms are not better.  If they are not better then we will call in an Rx for singulair  We wills see you back in May 2014 after the metacholine challenge test

## 2012-04-07 NOTE — Assessment & Plan Note (Signed)
She is doing much better since quitting smoking back in January.  The question remains as to whether or not she has asthma as she has been given this diagnosis over the years and she has a lot of allergies.   Plan: -decrease dulera to asmanex (samples given, she will call if it is working so we can send in an Rx) -metacholine challenge test before next office visit -f/u with [place] in three months

## 2012-05-24 ENCOUNTER — Other Ambulatory Visit: Payer: Self-pay | Admitting: Internal Medicine

## 2012-05-24 NOTE — Telephone Encounter (Signed)
Ok to refill alprazolam,   Authorized in epic 

## 2012-05-26 NOTE — Telephone Encounter (Signed)
Med phoned in °

## 2012-06-12 ENCOUNTER — Telehealth: Payer: Self-pay | Admitting: Internal Medicine

## 2012-06-12 MED ORDER — MONTELUKAST SODIUM 10 MG PO TABS: 10.0000 mg | ORAL_TABLET | Freq: Every day | ORAL | Status: DC

## 2012-06-12 NOTE — Telephone Encounter (Signed)
No I will not refill the tussionex,  If Dr Kendrick Fries has not been refilling it then I am not either. she has several allergy medications on her list.  She can try combining allegra and her nasal spray and  I will add singulair.

## 2012-06-12 NOTE — Telephone Encounter (Signed)
Patient wanted to know if there was a stronger allergy medication and she wants some cough medication. She is taking allegra for her allergies and she is wanting tussinex for her cough. FYI she has quite smoking .

## 2012-06-13 NOTE — Telephone Encounter (Signed)
Advised patient and to call back if not improved.

## 2012-10-10 ENCOUNTER — Ambulatory Visit: Payer: Self-pay | Admitting: Internal Medicine

## 2012-10-10 ENCOUNTER — Encounter: Payer: Self-pay | Admitting: Internal Medicine

## 2012-10-12 ENCOUNTER — Telehealth: Payer: Self-pay | Admitting: Internal Medicine

## 2012-10-12 NOTE — Telephone Encounter (Signed)
Her mammogram was incomplete on the left.  She will be contacted to get additional films by the facility.  

## 2012-10-15 ENCOUNTER — Ambulatory Visit: Payer: Self-pay | Admitting: Internal Medicine

## 2012-10-15 NOTE — Telephone Encounter (Signed)
Additional films to be done today at 3.30. Patient aware.

## 2012-10-16 ENCOUNTER — Telehealth: Payer: Self-pay | Admitting: Internal Medicine

## 2012-10-16 NOTE — Telephone Encounter (Signed)
I'm happy to hear that the additional views of the left breast were normal. No mass. We will continue annual mammograms.

## 2012-10-17 NOTE — Telephone Encounter (Signed)
Left message for pt to return my call.

## 2012-10-30 ENCOUNTER — Encounter: Payer: Self-pay | Admitting: Internal Medicine

## 2013-02-03 ENCOUNTER — Other Ambulatory Visit: Payer: Self-pay | Admitting: Internal Medicine

## 2013-02-03 NOTE — Telephone Encounter (Signed)
Last visit 12/28/11

## 2013-12-25 ENCOUNTER — Ambulatory Visit: Payer: Self-pay | Admitting: Internal Medicine

## 2013-12-25 LAB — HM MAMMOGRAPHY: HM Mammogram: NORMAL

## 2013-12-30 ENCOUNTER — Encounter: Payer: Self-pay | Admitting: Internal Medicine

## 2014-06-28 ENCOUNTER — Encounter: Payer: Self-pay | Admitting: Internal Medicine

## 2014-06-28 ENCOUNTER — Ambulatory Visit (INDEPENDENT_AMBULATORY_CARE_PROVIDER_SITE_OTHER): Payer: 59 | Admitting: Internal Medicine

## 2014-06-28 VITALS — BP 126/84 | HR 87 | Temp 98.6°F | Resp 14 | Ht 62.0 in | Wt 145.0 lb

## 2014-06-28 DIAGNOSIS — R002 Palpitations: Secondary | ICD-10-CM | POA: Diagnosis not present

## 2014-06-28 DIAGNOSIS — Z1159 Encounter for screening for other viral diseases: Secondary | ICD-10-CM

## 2014-06-28 DIAGNOSIS — E785 Hyperlipidemia, unspecified: Secondary | ICD-10-CM

## 2014-06-28 DIAGNOSIS — L989 Disorder of the skin and subcutaneous tissue, unspecified: Secondary | ICD-10-CM

## 2014-06-28 DIAGNOSIS — J411 Mucopurulent chronic bronchitis: Secondary | ICD-10-CM

## 2014-06-28 DIAGNOSIS — M797 Fibromyalgia: Secondary | ICD-10-CM

## 2014-06-28 DIAGNOSIS — M064 Inflammatory polyarthropathy: Secondary | ICD-10-CM | POA: Diagnosis not present

## 2014-06-28 DIAGNOSIS — M130 Polyarthritis, unspecified: Secondary | ICD-10-CM | POA: Insufficient documentation

## 2014-06-28 DIAGNOSIS — J309 Allergic rhinitis, unspecified: Secondary | ICD-10-CM

## 2014-06-28 DIAGNOSIS — Z716 Tobacco abuse counseling: Secondary | ICD-10-CM

## 2014-06-28 LAB — CBC WITH DIFFERENTIAL/PLATELET
BASOS ABS: 0 10*3/uL (ref 0.0–0.1)
Basophils Relative: 0.5 % (ref 0.0–3.0)
Eosinophils Absolute: 0.1 10*3/uL (ref 0.0–0.7)
Eosinophils Relative: 1.3 % (ref 0.0–5.0)
HEMATOCRIT: 44.8 % (ref 36.0–46.0)
Hemoglobin: 15.4 g/dL — ABNORMAL HIGH (ref 12.0–15.0)
LYMPHS ABS: 2.9 10*3/uL (ref 0.7–4.0)
Lymphocytes Relative: 29.9 % (ref 12.0–46.0)
MCHC: 34.4 g/dL (ref 30.0–36.0)
MCV: 84.8 fl (ref 78.0–100.0)
MONOS PCT: 7.8 % (ref 3.0–12.0)
Monocytes Absolute: 0.7 10*3/uL (ref 0.1–1.0)
NEUTROS ABS: 5.8 10*3/uL (ref 1.4–7.7)
NEUTROS PCT: 60.5 % (ref 43.0–77.0)
PLATELETS: 255 10*3/uL (ref 150.0–400.0)
RBC: 5.29 Mil/uL — ABNORMAL HIGH (ref 3.87–5.11)
RDW: 14.5 % (ref 11.5–15.5)
WBC: 9.6 10*3/uL (ref 4.0–10.5)

## 2014-06-28 LAB — URIC ACID: Uric Acid, Serum: 4.6 mg/dL (ref 2.4–7.0)

## 2014-06-28 LAB — COMPREHENSIVE METABOLIC PANEL
ALT: 25 U/L (ref 0–35)
AST: 21 U/L (ref 0–37)
Albumin: 4.6 g/dL (ref 3.5–5.2)
Alkaline Phosphatase: 113 U/L (ref 39–117)
BILIRUBIN TOTAL: 0.7 mg/dL (ref 0.2–1.2)
BUN: 10 mg/dL (ref 6–23)
CALCIUM: 9.9 mg/dL (ref 8.4–10.5)
CO2: 28 meq/L (ref 19–32)
CREATININE: 0.85 mg/dL (ref 0.40–1.20)
Chloride: 102 mEq/L (ref 96–112)
GFR: 72.73 mL/min (ref >60.00)
GLUCOSE: 86 mg/dL (ref 70–99)
Potassium: 4.2 mEq/L (ref 3.5–5.1)
Sodium: 138 mEq/L (ref 135–145)
Total Protein: 7.2 g/dL (ref 6.0–8.3)

## 2014-06-28 LAB — FERRITIN: FERRITIN: 129.6 ng/mL (ref 10.0–291.0)

## 2014-06-28 LAB — SEDIMENTATION RATE: Sed Rate: 12 mm/hr (ref 0–22)

## 2014-06-28 LAB — C-REACTIVE PROTEIN: CRP: 0.6 mg/dL (ref 0.5–20.0)

## 2014-06-28 NOTE — Patient Instructions (Signed)

## 2014-06-28 NOTE — Progress Notes (Signed)
Pre-visit discussion using our clinic review tool. No additional management support is needed unless otherwise documented below in the visit note.  

## 2014-06-28 NOTE — Progress Notes (Signed)
Patient ID: Katrina Martinez, female   DOB: 1955/05/16, 59 y.o.   MRN: [id number]   Patient Active Problem List   Diagnosis Date Noted  . Polyarthritis of multiple sites 06/28/2014  . Allergic rhinitis 04/07/2012  . Chronic bronchitis, mucopurulent 02/05/2012  . Cough 02/05/2012  . Routine general medical examination at a health care facility 07/05/2011  . Tobacco abuse 01/06/2011  . Tobacco abuse counseling 01/06/2011  . Fibromyalgia syndrome 01/06/2011  . Headache, drug induced 01/06/2011  . Fibrocystic breast disease in female   . Anxiety   . Fibromyalgia   . Elevated blood pressure reading without diagnosis of hypertension 01/03/2011    Subjective:  CC:   Chief Complaint  Patient presents with  . Acute Visit    Arthritis Martinez, nodules on 1st joint of each phalange, jother joints in phalange are red .  Katrina Martinez    left hip radiates down leg, Place on right posterior calf  . Rash    All over arm and back.    HPI:   Katrina Martinez is a 59 y.o. female who presents for   Multiple Martinez complaints,  She was last seen by me in Nov 2013.  Chronic  sinusitis and bronchitis .  Had allergy testing done at [facility] and was negative.  Sees [employer] [facility] Environmental consultant. Just found out that her husband [employer] company has been using Bromine to clean the air coming into the plant and thinks it is on her  husband's clothes when he comes home.  She still smoking but "only occasionally becausre i don't drink and it manages my nerves."  Cites mother in [place] ailing health as stressor. bc she wsa primary caregiver.  Mother in [place] passed away 04-10-13.   Current stressors include husband's impending layoff. Husband about to lose his job.   Advised to see Pulmonology  2) joint Martinez :  DIPs .    Joints feel warm to patients on knuckles /PIPs comes and goes .  Dr Bryon Lions had offered medication.  Taking Norco bid   Not using an antinflammatory  3) Left knee Martinez under treatment by [facility]  Dr Bryon Lions,  Has had i/a injections  And cortisone .  Trying to postpone knee replacement  4) RIGHT CALF LESION STARTED AS A hard tender NODULE  lower posterior calf.  Cut it while shaving and states that when the punctum was unroofed, a thin  white thread was exposed, which she extracted with tweezers  In two pieces  The length of which was 1 cm.  Area bled profusely and is still tender,  Has not completely  Healed.  Has not seen dermatology.  Past Medical History  Diagnosis Date  . Tobacco abuse   . Hyperlipidemia   . Fibrocystic breast disease in female     annual mammograms Feb 2012 , [place]  . Anxiety   . Fibromyalgia     did not tolerate Cymbalta trial (per records)  . Arthritis   . Headache(784.0)   . Heart murmur     echo do 8 hr ago-arh    Past Surgical History  Procedure Laterality Date  . Oophorectomy      bilateral  . Abdominal hysterectomy    . Monarch sling    . Tonsillectomy    . Appendectomy    . Tubal ligation         The following portions of the patient's history were reviewed and updated as appropriate: Allergies,  current medications, and problem list.    Review of Systems:   Patient denies headache, fevers, malaise, unintentional weight loss, skin rash, eye Martinez, sinus congestion and sinus Martinez, sore throat, dysphagia,  hemoptysis , cough, dyspnea, wheezing, chest Martinez, palpitations, orthopnea, edema, abdominal Martinez, nausea, melena, diarrhea, constipation, flank Martinez, dysuria, hematuria, urinary  Frequency, nocturia, numbness, tingling, seizures,  Focal weakness, Loss of consciousness,  Tremor, insomnia, depression, anxiety, and suicidal ideation.     History   Social History  . Marital Status: Married    Spouse Name: N/A  . Number of Children: N/A  . Years of Education: N/A   Occupational History  . Not on file.   Social History Main Topics  . Smoking status: Former Smoker -- 0.50 packs/day for 32 years    Types: Cigarettes  .  Smokeless tobacco: Never Used     Comment: pt has cut back on smoking . she states she smokes one cigarette a day two at the most-last reviewed 03/03/12  . Alcohol Use: No  . Drug Use: No  . Sexual Activity: Not on file   Other Topics Concern  . Not on file   Social History Narrative    Objective:  Filed Vitals:   06/28/14 1142  BP: 126/84  Pulse: 87  Temp: 98.6 F (37 C)  Resp: 14     General appearance: alert, cooperative and appears stated age Ears: normal TM's and external ear canals both ears Throat: lips, mucosa, and tongue normal; teeth and gums normal Neck: no adenopathy, no carotid bruit, supple, symmetrical, trachea midline and thyroid not enlarged, symmetric, no tenderness/mass/nodules Back: symmetric, no curvature. ROM normal. No CVA tenderness. Lungs: clear to auscultation bilaterally Heart: regular rate and rhythm, S1, S2 normal, no murmur, click, rub or gallop Abdomen: soft, non-tender; bowel sounds normal; no masses,  no organomegaly Pulses: 2+ and symmetric Skin: Skin color, texture, turgor normal. No rashes or lesions Lymph nodes: Cervical, supraclavicular, and axillary nodes normal.  Assessment and Plan:  Chronic bronchitis, mucopurulent Symptoms persistent due to continued use of tobacco .  Had recent allergy testing done per patient.  Follow up iwht Pulmonology,     Fibromyalgia syndrome Prior neurologic evaluion in 2006 by Germain Osgood with question of MS raised due to nonspecific changes on MRI and a brief history of diplopia.  She had a repeat MRI one year after initial imaging, with no changes seen.  Patient has not followed up with Neurology but has been referred both to [facility] and to [facility] in the past .    She has not tolerad trial of cymbalta in the past.     Polyarthritis of multiple sites Hands,  Hips and knees affected .  Has not had a workup for arthritis. i the last several years. Serologies for inflammatory arthritis  syndromes have been ordered.  Advised to try NSAIDs for now,  Rheumatology referral pending review of labs.   Lab Results  Component Value Date   ESRSEDRATE 12 06/28/2014   Lab Results  Component Value Date   CRP 0.6 06/28/2014   Lab Results  Component Value Date   RF <10 06/28/2014   No results found for: URICACID   Tobacco abuse counseling The patient was counseled on the dangers of tobacco use, and was advised to quit.  Reviewed strategies to maximize success, including removing cigarettes and smoking materials from environment.   Allergic rhinitis Patient states that allergy testing was done by ENT and negative,  But believes  her symptoms are coming from the odor of bromine on her husband's work clothes.  Records requested.    Skin lesion of left leg Patient reports an unusually significant description , may have been an infected sebaceous cyst.  No recent travel to suggest a parasitic infection . Appears to be healing now.  No workup indicated.   A total of 40 minutes was spent with patient more than half of which was spent in counseling patient on the above mentioned issues , reviewing and explaining recent labs and imaging studies done, and coordination of care.  Updated Medication List Outpatient Encounter Prescriptions as of 06/28/2014  Medication Sig  . albuterol (PROVENTIL HFA;VENTOLIN HFA) 108 (90 BASE) MCG/ACT inhaler Inhale 2 puffs into the lungs every 6 (six) hours as needed.  . ALPRAZolam (XANAX) 0.5 MG tablet TAKE 1 TABLET BY MOUTH TWICE A DAY AS NEEDED (Patient not taking: Reported on 06/28/2014)  . azelastine (ASTELIN) 137 MCG/SPRAY nasal spray Place 2 sprays into the nose 2 (two) times daily. Use in each nostril as directed  . ciclesonide (OMNARIS) 50 MCG/ACT nasal spray Place 2 sprays into both nostrils daily. As needed  . fexofenadine (ALLEGRA) 180 MG tablet Take 180 mg by mouth daily.    . mometasone (ASMANEX 60 METERED DOSES) 220 MCG/INH inhaler Inhale 1 puff  into the lungs daily. (Patient not taking: Reported on 06/28/2014)  . mometasone-formoterol (DULERA) 200-5 MCG/ACT AERO Inhale 2 puffs into the lungs 2 (two) times daily. (Patient not taking: Reported on 06/28/2014)  . montelukast (SINGULAIR) 10 MG tablet Take 1 tablet (10 mg total) by mouth at bedtime. (Patient not taking: Reported on 06/28/2014)  . sodium chloride (OCEAN) 0.65 % nasal spray Place 1 spray into the nose as needed.  . [DISCONTINUED] guaiFENesin-codeine (ROBITUSSIN AC) 100-10 MG/5ML syrup Take 10 mLs by mouth 3 (three) times daily as needed for cough. (Patient not taking: Reported on 06/28/2014)   No facility-administered encounter medications on file as of 06/28/2014.

## 2014-06-29 DIAGNOSIS — L989 Disorder of the skin and subcutaneous tissue, unspecified: Secondary | ICD-10-CM | POA: Insufficient documentation

## 2014-06-29 LAB — RHEUMATOID FACTOR

## 2014-06-29 LAB — ANA W/REFLEX IF POSITIVE: Anti Nuclear Antibody(ANA): NEGATIVE

## 2014-06-29 LAB — IRON AND TIBC
%SAT: 28 % (ref 20–55)
IRON: 87 ug/dL (ref 42–145)
TIBC: 314 ug/dL (ref 250–470)
UIBC: 227 ug/dL (ref 125–400)

## 2014-06-29 NOTE — Assessment & Plan Note (Signed)
Hands,  Hips and knees affected .  Has not had a workup for arthritis. i the last several years. Serologies for inflammatory arthritis syndromes have been ordered.  Advised to try NSAIDs for now,  Rheumatology referral pending review of labs.   Lab Results  Component Value Date   ESRSEDRATE 12 06/28/2014   Lab Results  Component Value Date   CRP 0.6 06/28/2014   Lab Results  Component Value Date   RF <10 06/28/2014   No results found for: URICACID

## 2014-06-29 NOTE — Assessment & Plan Note (Signed)
Symptoms persistent due to continued use of tobacco .  Had recent allergy testing done per patient.  Follow up iwht Pulmonology,

## 2014-06-29 NOTE — Assessment & Plan Note (Signed)
The patient was counseled on the dangers of tobacco use, and was advised to quit.  Reviewed strategies to maximize success, including removing cigarettes and smoking materials from environment. 

## 2014-06-29 NOTE — Assessment & Plan Note (Signed)
Patient reports an unusually significant description , may have been an infected sebaceous cyst.  No recent travel to suggest a parasitic infection . Appears to be healing now.  No workup indicated.

## 2014-06-29 NOTE — Assessment & Plan Note (Signed)
Prior neurologic evaluion in 2006 by Germain Osgood with question of MS raised due to nonspecific changes on MRI and a brief history of diplopia.  She had a repeat MRI one year after initial imaging, with no changes seen.  Patient has not followed up with Neurology but has been referred both to [facility] and to [facility] in the past .    She has not tolerad trial of cymbalta in the past.

## 2014-06-29 NOTE — Assessment & Plan Note (Signed)
Patient states that allergy testing was done by ENT and negative,  But believes her symptoms are coming from the odor of bromine on her husband's work clothes.  Records requested.

## 2014-06-30 LAB — CYCLIC CITRUL PEPTIDE ANTIBODY, IGG: Cyclic Citrullin Peptide Ab: <2 U/mL (ref 0.0–5.0)

## 2014-07-02 LAB — HLA-B27 ANTIGEN: DNA RESULT: NOT DETECTED

## 2014-07-05 NOTE — Addendum Note (Signed)
Addended by: Crecencio Mc on: 07/05/2014 01:29 PM   Modules accepted: Orders, Medications

## 2014-07-29 ENCOUNTER — Ambulatory Visit: Payer: 59 | Admitting: Internal Medicine

## 2014-08-09 ENCOUNTER — Encounter: Payer: Self-pay | Admitting: Internal Medicine

## 2014-08-09 ENCOUNTER — Ambulatory Visit (INDEPENDENT_AMBULATORY_CARE_PROVIDER_SITE_OTHER): Payer: 59 | Admitting: Internal Medicine

## 2014-08-09 VITALS — BP 128/82 | HR 66 | Temp 98.1°F | Resp 14 | Ht 62.0 in | Wt 144.5 lb

## 2014-08-09 DIAGNOSIS — L989 Disorder of the skin and subcutaneous tissue, unspecified: Secondary | ICD-10-CM | POA: Diagnosis not present

## 2014-08-09 DIAGNOSIS — J411 Mucopurulent chronic bronchitis: Secondary | ICD-10-CM

## 2014-08-09 DIAGNOSIS — B9789 Other viral agents as the cause of diseases classified elsewhere: Principal | ICD-10-CM

## 2014-08-09 DIAGNOSIS — M064 Inflammatory polyarthropathy: Secondary | ICD-10-CM | POA: Diagnosis not present

## 2014-08-09 DIAGNOSIS — M130 Polyarthritis, unspecified: Secondary | ICD-10-CM

## 2014-08-09 DIAGNOSIS — J069 Acute upper respiratory infection, unspecified: Secondary | ICD-10-CM | POA: Diagnosis not present

## 2014-08-09 MED ORDER — PREDNISONE 10 MG PO TABS
ORAL_TABLET | ORAL | Status: DC
Start: 2014-08-09 — End: 2015-02-02

## 2014-08-09 MED ORDER — DOXYCYCLINE HYCLATE 100 MG PO CAPS: 100.0000 mg | ORAL_CAPSULE | Freq: Two times a day (BID) | ORAL | Status: DC

## 2014-08-09 NOTE — Patient Instructions (Signed)
.  You have a viral syndrome which is causing a mild bronchitis.    The post nasal drip may alsoe be contributing to  your  Sore throat  Cough.  I am prescribing a prednisone taper to manage the inflammation in your bronchial tubes if you don't improve in a few days.   I also advise use of the following OTC meds to help with your other symptoms.   Take generic OTC benadryl 25 mg  At bedtime for the drainage,,  Delsym for daytime cough. flush your sinuses twice daily with Milta Deiters Med's sinus rinse.   If the coughing has not improved in  48 hours  OR  if you develop T > 100.4,  Green nasal discharge,  Or facial pain, start the levaquin.  Please take a probiotic ( Align, Floraque or Culturelle) while you are on the antibiotic to prevent a serious antibiotic associated diarrhea  Called clostridium dificile colitis and a vaginal yeast infection

## 2014-08-09 NOTE — Progress Notes (Signed)
Pre-visit discussion using our clinic review tool. No additional management support is needed unless otherwise documented below in the visit note.  

## 2014-08-09 NOTE — Progress Notes (Signed)
Subjective:  Patient ID: Katrina Martinez, female    DOB: 1955-08-13  Age: 59 y.o. MRN: [id number]  CC: The primary encounter diagnosis was Viral URI with cough. Diagnoses of Polyarthritis of multiple sites, Chronic bronchitis, mucopurulent, and Skin lesion of left leg were also pertinent to this visit.  HPI Katrina Martinez presents for follow up on allergic rhinitis , bronchitis, tobacco abuse and chronic pain .  Serologies  for autoimmune disease were negative and patient hs been referred to [facility]. Has an appointment on Wednesday June 22    Currently coughing for the past 3 days, symptoms started after working in the yard.  She reports sore throat,  several sick contacts. Both grandchildren.  Lateral Neck is stiff on the left side,  Ear hurting on the left.  Sees Dr Nehemiah Massed for diffuse rash on forearms,  Chest and back intensely pruritic,  Has been present for over a month    Outpatient Prescriptions Prior to Visit  Medication Sig Dispense Refill  . sodium chloride (OCEAN) 0.65 % nasal spray Place 1 spray into the nose as needed.    Marland Kitchen albuterol (PROVENTIL HFA;VENTOLIN HFA) 108 (90 BASE) MCG/ACT inhaler Inhale 2 puffs into the lungs every 6 (six) hours as needed.    Marland Kitchen azelastine (ASTELIN) 137 MCG/SPRAY nasal spray Place 2 sprays into the nose 2 (two) times daily. Use in each nostril as directed 30 mL 2  . ciclesonide (OMNARIS) 50 MCG/ACT nasal spray Place 2 sprays into both nostrils daily. As needed    . fexofenadine (ALLEGRA) 180 MG tablet Take 180 mg by mouth daily.       No facility-administered medications prior to visit.    Review of Systems;  Patient denies headache, fevers, malaise, unintentional weight loss, skin rash, eye pain, sinus congestion and sinus pain, sore throat, dysphagia,  hemoptysis , cough, dyspnea, wheezing, chest pain, palpitations, orthopnea, edema, abdominal pain, nausea, melena, diarrhea, constipation, flank pain, dysuria, hematuria, urinary  Frequency,  nocturia, numbness, tingling, seizures,  Focal weakness, Loss of consciousness,  Tremor, insomnia, depression, anxiety, and suicidal ideation.      Objective:  BP 128/82 mmHg  Pulse 66  Temp(Src) 98.1 F (36.7 C) (Oral)  Resp 14  Ht 5\' 2"  (1.575 m)  Wt 144 lb 8 oz (65.545 kg)  BMI 26.42 kg/m2  SpO2 98%  BP Readings from Last 3 Encounters:  08/09/14 128/82  06/28/14 126/84  04/07/12 150/82    Wt Readings from Last 3 Encounters:  08/09/14 144 lb 8 oz (65.545 kg)  06/28/14 145 lb (65.772 kg)  04/07/12 142 lb (64.411 kg)    General appearance: alert, cooperative and appears stated age Ears: normal TM's and external ear canals both ears Throat: lips, mucosa, and tongue normal; teeth and gums normal Neck: no adenopathy, no carotid bruit, supple, symmetrical, trachea midline and thyroid not enlarged, symmetric, no tenderness/mass/nodules Back: symmetric, no curvature. ROM normal. No CVA tenderness. Lungs: clear to auscultation bilaterally Heart: regular rate and rhythm, S1, S2 normal, no murmur, click, rub or gallop Abdomen: soft, non-tender; bowel sounds normal; no masses,  no organomegaly Pulses: 2+ and symmetric Skin: Skin color, texture, turgor normal. No rashes or lesions Lymph nodes: Cervical, supraclavicular, and axillary nodes normal.  No results found for: HGBA1C  Lab Results  Component Value Date   CREATININE 0.85 06/28/2014    Lab Results  Component Value Date   WBC 9.6 06/28/2014   HGB 15.4* 06/28/2014   HCT 44.8 06/28/2014  PLT 255.0 06/28/2014   GLUCOSE 86 06/28/2014   ALT 25 06/28/2014   AST 21 06/28/2014   NA 138 06/28/2014   K 4.2 06/28/2014   CL 102 06/28/2014   CREATININE 0.85 06/28/2014   BUN 10 06/28/2014   CO2 28 06/28/2014    No results found.  Assessment & Plan:   Problem List Items Addressed This Visit    Polyarthritis of multiple sites    Hands,  Hips and knees affected .  Has not had a workup for arthritis. i the last several  years. Serologies for inflammatory arthritis syndromes have been ordered.  Advised to continue  NSAIDs,  Rheumatology referral pending .  Lab Results  Component Value Date   ESRSEDRATE 12 06/28/2014   Lab Results  Component Value Date   CRP 0.6 06/28/2014   Lab Results  Component Value Date   ANA Negative 06/28/2014   RF <10 06/28/2014   No results found for: URICACID        Skin lesion of left leg    She is scheduled to see Dr Nehemiah Massed soon, and continue to have a small ulceration at the site      Viral URI with cough - Primary    URI is most likely viral given the mild HEENT  Symptoms  And normal exam.   I have explained that in viral URIS, an antibiotic will not help the symptoms and will increase the risk of developing diarrhea.,  Continue oral and nasal decongestants, and tylenol 650 mq 8 hrs for aches and pains,  And will prednisone  taper for inflammation      Chronic bronchitis, mucopurulent      I am having Ms. Strawderman start on predniSONE and doxycycline. I am also having her maintain her fexofenadine, albuterol, ciclesonide, sodium chloride, azelastine, and HYDROcodone-acetaminophen.  Meds ordered this encounter  Medications  . HYDROcodone-acetaminophen (NORCO) 10-325 MG per tablet    Sig: Take 1 tablet by mouth 4 (four) times daily as needed.    Refill:  0  . predniSONE (DELTASONE) 10 MG tablet    Sig: 6 tablets on Day 1 , then reduce by 1 tablet daily until gone    Dispense:  21 tablet    Refill:  0  . doxycycline (VIBRAMYCIN) 100 MG capsule    Sig: Take 1 capsule (100 mg total) by mouth 2 (two) times daily.    Dispense:  14 capsule    Refill:  0    There are no discontinued medications.  Follow-up: No Follow-up on file.   Crecencio Mc, MD

## 2014-08-10 ENCOUNTER — Encounter: Payer: Self-pay | Admitting: Internal Medicine

## 2014-08-10 NOTE — Assessment & Plan Note (Signed)
She is scheduled to see Dr Nehemiah Massed soon, and continue to have a small ulceration at the site

## 2014-08-10 NOTE — Assessment & Plan Note (Signed)
URI is most likely viral given the mild HEENT  Symptoms  And normal exam.   I have explained that in viral URIS, an antibiotic will not help the symptoms and will increase the risk of developing diarrhea.,  Continue oral and nasal decongestants, and tylenol 650 mq 8 hrs for aches and pains,  And will prednisone  taper for inflammation 

## 2014-08-10 NOTE — Assessment & Plan Note (Signed)
Hands,  Hips and knees affected .  Has not had a workup for arthritis. i the last several years. Serologies for inflammatory arthritis syndromes have been ordered.  Advised to continue  NSAIDs,  Rheumatology referral pending .  Lab Results  Component Value Date   ESRSEDRATE 12 06/28/2014   Lab Results  Component Value Date   CRP 0.6 06/28/2014   Lab Results  Component Value Date   ANA Negative 06/28/2014   RF <10 06/28/2014   No results found for: URICACID

## 2014-08-18 ENCOUNTER — Telehealth: Payer: Self-pay | Admitting: *Deleted

## 2014-08-18 MED ORDER — HYDROCOD POLST-CPM POLST ER 10-8 MG/5ML PO SUER: 5.0000 mL | Freq: Every evening | ORAL | Status: DC | PRN

## 2014-08-18 MED ORDER — ALBUTEROL SULFATE HFA 108 (90 BASE) MCG/ACT IN AERS: 2.0000 | INHALATION_SPRAY | Freq: Four times a day (QID) | RESPIRATORY_TRACT | Status: DC | PRN

## 2014-08-18 NOTE — Telephone Encounter (Signed)
Both have been filled  No refills , if symptoms persist, she will need to see pulmonoloigist

## 2014-08-18 NOTE — Telephone Encounter (Signed)
Spoke with pt, advised of follow up if persistent symptoms. Pt verbalized understanding

## 2014-08-18 NOTE — Telephone Encounter (Signed)
She has an albuterol inhaler in her chart  Please clarify what she is requesting . tussionex will  be refilled only once without appt

## 2014-08-18 NOTE — Telephone Encounter (Signed)
She is requesting the Albuterol inhaler, it has not been filled by you before.  The Tussionex she is requesting for the continued cough.  Please advise

## 2014-08-18 NOTE — Telephone Encounter (Signed)
Pt called states the cough is getting worse. Pt is requesting a Rx for an inhaler and Tussionex.  Further states she is going to fill the Prednisone Rx today.  Please advise

## 2014-09-24 ENCOUNTER — Encounter: Payer: Self-pay | Admitting: Family Medicine

## 2014-09-24 ENCOUNTER — Ambulatory Visit (INDEPENDENT_AMBULATORY_CARE_PROVIDER_SITE_OTHER): Payer: 59 | Admitting: Family Medicine

## 2014-09-24 VITALS — BP 138/86 | HR 93 | Temp 98.8°F | Ht 62.0 in | Wt 143.5 lb

## 2014-09-24 DIAGNOSIS — F419 Anxiety disorder, unspecified: Secondary | ICD-10-CM | POA: Diagnosis not present

## 2014-09-24 MED ORDER — DIAZEPAM 5 MG PO TABS: 5.0000 mg | ORAL_TABLET | Freq: Two times a day (BID) | ORAL | Status: DC | PRN

## 2014-09-24 NOTE — Patient Instructions (Signed)
Nice to see you.  We will give you a short course of valium to help with your anxiety.  If you develop thoughts of hurting your self or others or if your anxiety worsens please seek medical attention.  Please set up follow-up for one week with Dr Derrel Nip.

## 2014-09-24 NOTE — Progress Notes (Signed)
Patient ID: Katrina Martinez, female   DOB: 16-Feb-1956, 59 y.o.   MRN: [id number]  Tommi Rumps, MD Phone: [phone]  Katrina Martinez is a 59 y.o. female who presents today for f/u.  Depression and anxiety: patient reports that over the past month she has progressively had increasing anxiety relating to recent diagnosis of dermatomyositis and her daughter recently losing her job and the patient having to help support her daughter and her grandkids. She notes history of anxiety previously treated with xanax and valium. Notes she tolerated the valium better. Has been She has been on zoloft and celexa and they were overly sedating. She notes that the biggest issue is anxiety about not knowing what is going to come of her dermatomyositis diagnosis. Has issues with sleep as she is dwelling on this diagnosis and how she is going to provide for her daughter and grandchildren. Denies alcohol use. No illicit drug use. Has a gun in the house. No SI or HI. GAD7 14 PHQ9 8  PMH: smoker.    ROS per HPI  Objective    Physical Exam Filed Vitals:   09/24/14 1527  BP: 138/86  Pulse: 93  Temp: 98.8 F (37.1 C)   Physical Exam  Constitutional: She is well-developed, well-nourished, and in no distress.  HENT:  Head: Normocephalic and atraumatic.  Cardiovascular: Normal rate, regular rhythm and normal heart sounds.   Pulmonary/Chest: Effort normal and breath sounds normal.  Psychiatric:  Mood anxious Affect anxious     Assessment/Plan: Please see individual problem list.  Anxiety Patient with recent recurrence related to life stressors. No SI or HI. Discussed treatment options including SSRI, therapy, and valium. Given prior responses to SSRI patient declined use of this. She declined use of therapy as well. Will start on short course of valium prn for this issue. Patient will follow-up with Dr Derrel Nip for this issue in one week. Given return precautions.     Tommi Rumps

## 2014-09-24 NOTE — Assessment & Plan Note (Signed)
Patient with recent recurrence related to life stressors. No SI or HI. Discussed treatment options including SSRI, therapy, and valium. Given prior responses to SSRI patient declined use of this. She declined use of therapy as well. Will start on short course of valium prn for this issue. Patient will follow-up with Dr Derrel Nip for this issue in one week. Given return precautions.

## 2014-09-27 ENCOUNTER — Other Ambulatory Visit: Payer: Self-pay | Admitting: Otolaryngology

## 2014-09-27 DIAGNOSIS — K1120 Sialoadenitis, unspecified: Secondary | ICD-10-CM

## 2014-09-28 ENCOUNTER — Other Ambulatory Visit: Payer: Self-pay | Admitting: Physician Assistant

## 2014-09-28 DIAGNOSIS — R197 Diarrhea, unspecified: Secondary | ICD-10-CM

## 2014-09-28 DIAGNOSIS — K5909 Other constipation: Secondary | ICD-10-CM

## 2014-09-28 DIAGNOSIS — R10814 Left lower quadrant abdominal tenderness: Secondary | ICD-10-CM

## 2014-09-28 DIAGNOSIS — R1032 Left lower quadrant pain: Secondary | ICD-10-CM

## 2014-09-30 ENCOUNTER — Ambulatory Visit: Payer: 59

## 2014-10-01 ENCOUNTER — Ambulatory Visit
Admission: RE | Admit: 2014-10-01 | Discharge: 2014-10-01 | Disposition: A | Discharge status: Home / Self Care | Payer: Commercial Managed Care - HMO | Source: Ambulatory Visit | Attending: Physician Assistant | Admitting: Physician Assistant

## 2014-10-01 DIAGNOSIS — R10814 Left lower quadrant abdominal tenderness: Secondary | ICD-10-CM

## 2014-10-01 DIAGNOSIS — R1032 Left lower quadrant pain: Secondary | ICD-10-CM | POA: Insufficient documentation

## 2014-10-01 DIAGNOSIS — R197 Diarrhea, unspecified: Secondary | ICD-10-CM | POA: Insufficient documentation

## 2014-10-01 DIAGNOSIS — K1120 Sialoadenitis, unspecified: Secondary | ICD-10-CM

## 2014-10-01 DIAGNOSIS — K5909 Other constipation: Secondary | ICD-10-CM

## 2014-10-01 MED ORDER — IOHEXOL 300 MG/ML  SOLN
125.0000 mL | Freq: Once | INTRAMUSCULAR | Status: AC | PRN
  Administered 2014-10-01: 125 mL via INTRAVENOUS

## 2014-10-27 ENCOUNTER — Encounter: Payer: Self-pay | Admitting: *Deleted

## 2014-10-28 ENCOUNTER — Encounter
Admission: RE | Disposition: A | Discharge status: Home / Self Care | Payer: Self-pay | Source: Ambulatory Visit | Attending: Unknown Physician Specialty

## 2014-10-28 ENCOUNTER — Ambulatory Visit
Admission: RE | Admit: 2014-10-28 | Discharge: 2014-10-28 | Disposition: A | Discharge status: Home / Self Care | Payer: Commercial Managed Care - HMO | Source: Ambulatory Visit | Attending: Unknown Physician Specialty | Admitting: Unknown Physician Specialty

## 2014-10-28 ENCOUNTER — Ambulatory Visit: Payer: Commercial Managed Care - HMO | Admitting: Anesthesiology

## 2014-10-28 DIAGNOSIS — F419 Anxiety disorder, unspecified: Secondary | ICD-10-CM | POA: Insufficient documentation

## 2014-10-28 DIAGNOSIS — M797 Fibromyalgia: Secondary | ICD-10-CM | POA: Diagnosis not present

## 2014-10-28 DIAGNOSIS — K5900 Constipation, unspecified: Secondary | ICD-10-CM | POA: Insufficient documentation

## 2014-10-28 DIAGNOSIS — D123 Benign neoplasm of transverse colon: Secondary | ICD-10-CM | POA: Insufficient documentation

## 2014-10-28 DIAGNOSIS — Z888 Allergy status to other drugs, medicaments and biological substances status: Secondary | ICD-10-CM | POA: Diagnosis not present

## 2014-10-28 DIAGNOSIS — M1990 Unspecified osteoarthritis, unspecified site: Secondary | ICD-10-CM | POA: Insufficient documentation

## 2014-10-28 DIAGNOSIS — R197 Diarrhea, unspecified: Secondary | ICD-10-CM | POA: Diagnosis present

## 2014-10-28 DIAGNOSIS — K640 First degree hemorrhoids: Secondary | ICD-10-CM | POA: Insufficient documentation

## 2014-10-28 DIAGNOSIS — M3500 Sicca syndrome, unspecified: Secondary | ICD-10-CM | POA: Insufficient documentation

## 2014-10-28 DIAGNOSIS — Z881 Allergy status to other antibiotic agents status: Secondary | ICD-10-CM | POA: Insufficient documentation

## 2014-10-28 DIAGNOSIS — R1012 Left upper quadrant pain: Secondary | ICD-10-CM | POA: Insufficient documentation

## 2014-10-28 DIAGNOSIS — Z7952 Long term (current) use of systemic steroids: Secondary | ICD-10-CM | POA: Insufficient documentation

## 2014-10-28 DIAGNOSIS — Z91040 Latex allergy status: Secondary | ICD-10-CM | POA: Insufficient documentation

## 2014-10-28 DIAGNOSIS — Z90710 Acquired absence of both cervix and uterus: Secondary | ICD-10-CM | POA: Insufficient documentation

## 2014-10-28 DIAGNOSIS — Z7951 Long term (current) use of inhaled steroids: Secondary | ICD-10-CM | POA: Diagnosis not present

## 2014-10-28 DIAGNOSIS — E785 Hyperlipidemia, unspecified: Secondary | ICD-10-CM | POA: Insufficient documentation

## 2014-10-28 DIAGNOSIS — F17210 Nicotine dependence, cigarettes, uncomplicated: Secondary | ICD-10-CM | POA: Diagnosis not present

## 2014-10-28 DIAGNOSIS — Z79891 Long term (current) use of opiate analgesic: Secondary | ICD-10-CM | POA: Insufficient documentation

## 2014-10-28 DIAGNOSIS — D122 Benign neoplasm of ascending colon: Secondary | ICD-10-CM | POA: Insufficient documentation

## 2014-10-28 DIAGNOSIS — R1032 Left lower quadrant pain: Secondary | ICD-10-CM | POA: Insufficient documentation

## 2014-10-28 DIAGNOSIS — D125 Benign neoplasm of sigmoid colon: Secondary | ICD-10-CM | POA: Diagnosis not present

## 2014-10-28 DIAGNOSIS — Z9049 Acquired absence of other specified parts of digestive tract: Secondary | ICD-10-CM | POA: Diagnosis not present

## 2014-10-28 HISTORY — DX: Dermatopolymyositis, unspecified, organ involvement unspecified: M33.90

## 2014-10-28 HISTORY — DX: Sjogren syndrome, unspecified: M35.00

## 2014-10-28 HISTORY — DX: Other dermatomyositis without myopathy: M33.13

## 2014-10-28 HISTORY — PX: COLONOSCOPY WITH PROPOFOL: SHX5780

## 2014-10-28 LAB — HM COLONOSCOPY

## 2014-10-28 SURGERY — COLONOSCOPY WITH PROPOFOL
Anesthesia: General

## 2014-10-28 MED ORDER — SODIUM CHLORIDE 0.9 % IV SOLN
INTRAVENOUS | Status: DC
  Administered 2014-10-28: 10:00:00 via INTRAVENOUS

## 2014-10-28 MED ORDER — PROPOFOL 10 MG/ML IV BOLUS
INTRAVENOUS | Status: DC | PRN
  Administered 2014-10-28 (×3): 20 mg via INTRAVENOUS
  Administered 2014-10-28: 30 mg via INTRAVENOUS
  Administered 2014-10-28 (×6): 20 mg via INTRAVENOUS
  Administered 2014-10-28: 30 mg via INTRAVENOUS
  Administered 2014-10-28: 20 mg via INTRAVENOUS

## 2014-10-28 MED ORDER — MIDAZOLAM HCL 2 MG/2ML IJ SOLN
INTRAMUSCULAR | Status: DC | PRN
  Administered 2014-10-28: 1 mg via INTRAVENOUS

## 2014-10-28 NOTE — H&P (Signed)
Primary Care Physician:  Crecencio Mc, MD Primary Gastroenterologist:  Dr. Vira Agar  Pre-Procedure History & Physical: HPI:  Katrina Martinez is a 59 y.o. female is here for an colonoscopy.   Past Medical History  Diagnosis Date  . Tobacco abuse   . Hyperlipidemia   . Fibrocystic breast disease in female     annual mammograms Feb 2012 , [place]  . Anxiety   . Arthritis   . Headache(784.0)   . Heart murmur     echo do 8 hr ago-arh  . Fibromyalgia     did not tolerate Cymbalta trial (per records)  . Arthritis   . Dermatomyositis   . Sjogren's syndrome     Past Surgical History  Procedure Laterality Date  . Oophorectomy      bilateral  . Abdominal hysterectomy    . Monarch sling    . Tonsillectomy    . Appendectomy    . Tubal ligation      Prior to Admission medications   Medication Sig Start Date End Date Taking? Authorizing Provider  albuterol (PROVENTIL HFA;VENTOLIN HFA) 108 (90 BASE) MCG/ACT inhaler Inhale 2 puffs into the lungs every 6 (six) hours as needed. 08/18/14   Crecencio Mc, MD  azelastine (ASTELIN) 137 MCG/SPRAY nasal spray Place 2 sprays into the nose 2 (two) times daily. Use in each nostril as directed 03/03/12 03/03/13  Juanito Doom, MD  chlorpheniramine-HYDROcodone [facility] PENNKINETIC ER) 10-8 MG/5ML SUER Take 5 mLs by mouth at bedtime as needed for cough. 08/18/14   Crecencio Mc, MD  ciclesonide (OMNARIS) 50 MCG/ACT nasal spray Place 2 sprays into both nostrils daily. As needed    Historical Provider, MD  diazepam (VALIUM) 5 MG tablet Take 1 tablet (5 mg total) by mouth every 12 (twelve) hours as needed for anxiety. 09/24/14   Leone Haven, MD  doxycycline (VIBRAMYCIN) 100 MG capsule Take 1 capsule (100 mg total) by mouth 2 (two) times daily. 08/09/14   Crecencio Mc, MD  fexofenadine (ALLEGRA) 180 MG tablet Take 180 mg by mouth daily.      Historical Provider, MD  HYDROcodone-acetaminophen (NORCO) 10-325 MG per tablet Take 1 tablet by  mouth 4 (four) times daily as needed. 07/27/14   Historical Provider, MD  hydroxychloroquine (PLAQUENIL) 200 MG tablet Take by mouth. 09/24/14   Historical Provider, MD  predniSONE (DELTASONE) 10 MG tablet 6 tablets on Day 1 , then reduce by 1 tablet daily until gone 08/09/14   Crecencio Mc, MD  sodium chloride (OCEAN) 0.65 % nasal spray Place 1 spray into the nose as needed.    Historical Provider, MD  triamcinolone cream (KENALOG) 0.1 % APPLY 1 APPLICATION TO SKIN TWICE DAILY AS NEEDED UNTIL CLEAR. AVOID FACE, GROIN, ARMPITS. 09/01/14   Historical Provider, MD    Allergies as of 10/01/2014 - Review Complete 10/01/2014  Allergen Reaction Noted  . Benzonatate Nausea Only 03/13/2011  . Hydrocortisone Other (See Comments) 09/24/2014  . Latex  01/03/2011  . Neosporin [neomycin-bacitracin zn-polymyx]  01/03/2011  . Polysporin [bacitracin-polymyxin b]  02/02/2011  . Hydrogen peroxide Rash 02/02/2011    Family History  Problem Relation Age of Onset  . Diabetes Mother   . Heart disease Father     Social History   Social History  . Marital Status: Married    Spouse Name: N/A  . Number of Children: N/A  . Years of Education: N/A   Occupational History  . Not on file.  Social History Main Topics  . Smoking status: Former Smoker -- 0.50 packs/day for 32 years    Types: Cigarettes  . Smokeless tobacco: Never Used     Comment: pt has cut back on smoking . she states she smokes one cigarette a day two at the most-last reviewed 03/03/12  . Alcohol Use: No  . Drug Use: No  . Sexual Activity: Not on file   Other Topics Concern  . Not on file   Social History Narrative    Review of Systems: See HPI, otherwise negative ROS  Physical Exam: BP 131/78 mmHg  Pulse 88  Temp(Src) 98.5 F (36.9 C) (Tympanic)  Resp 17  Ht 5\' 6"  (1.676 m)  Wt 62.596 kg (138 lb)  BMI 22.28 kg/m2  SpO2 100% General:   Alert,  pleasant and cooperative in NAD Head:  Normocephalic and atraumatic. Neck:   Supple; no masses or thyromegaly. Lungs:  Clear throughout to auscultation.    Heart:  Regular rate and rhythm. Abdomen:  Soft, nontender and nondistended. Normal bowel sounds, without guarding, and without rebound.   Neurologic:  Alert and  oriented x4;  grossly normal neurologically.  Impression/Plan: Katrina Martinez is here for an colonoscopy to be performed for LUQ and LLQ abd pain.  Risks, benefits, limitations, and alternatives regarding  colonoscopy have been reviewed with the patient.  Questions have been answered.  All parties agreeable.   Gaylyn Cheers, MD  10/28/2014, 9:58 AM

## 2014-10-28 NOTE — Anesthesia Preprocedure Evaluation (Signed)
Anesthesia Evaluation  Patient identified by MRN, date of birth, ID band Patient awake    Reviewed: Allergy & Precautions, H&P , NPO status , Patient's Chart, lab work & pertinent test results, reviewed documented beta blocker date and time   Airway Mallampati: II  TM Distance: >3 FB Neck ROM: full    Dental no notable dental hx.    Pulmonary neg pulmonary ROS, former smoker,    Pulmonary exam normal breath sounds clear to auscultation       Cardiovascular Exercise Tolerance: Good negative cardio ROS  + Valvular Problems/Murmurs  Rhythm:regular Rate:Normal     Neuro/Psych  Headaches,  Neuromuscular disease negative neurological ROS  negative psych ROS   GI/Hepatic negative GI ROS, Neg liver ROS,   Endo/Other  negative endocrine ROS  Renal/GU negative Renal ROS  negative genitourinary   Musculoskeletal   Abdominal   Peds  Hematology negative hematology ROS (+)   Anesthesia Other Findings Sjogrens dz  Reproductive/Obstetrics negative OB ROS                             Anesthesia Physical Anesthesia Plan  ASA: III  Anesthesia Plan: General   Post-op Pain Management:    Induction:   Airway Management Planned:   Additional Equipment:   Intra-op Plan:   Post-operative Plan:   Informed Consent: I have reviewed the patients History and Physical, chart, labs and discussed the procedure including the risks, benefits and alternatives for the proposed anesthesia with the patient or authorized representative who has indicated his/her understanding and acceptance.   Dental Advisory Given  Plan Discussed with: CRNA  Anesthesia Plan Comments:         Anesthesia Quick Evaluation

## 2014-10-28 NOTE — Op Note (Signed)
[facility] Patient Name: Katrina Martinez Procedure Date: 10/28/2014 9:51 AM MRN: [id number] Account #: [id number] Date of Birth: [birth year] Admit Type: Outpatient Age: 59 Room: [facility] ENDO ROOM 1 Gender: Female Note Status: Finalized Procedure:         Colonoscopy Indications:       Abdominal pain in the left lower quadrant, Abdominal pain                     in the left upper quadrant Providers:         Manya Silvas, MD Referring MD:      Deborra Medina, MD (Referring MD) Medicines:         Propofol per Anesthesia Complications:     No immediate complications. Procedure:         Pre-Anesthesia Assessment:                    - After reviewing the risks and benefits, the patient was                     deemed in satisfactory condition to undergo the procedure.                    After obtaining informed consent, the colonoscope was                     passed under direct vision. Throughout the procedure, the                     patient's blood pressure, pulse, and oxygen saturations                     were monitored continuously. The Colonoscope was                     introduced through the anus and advanced to the the cecum,                     identified by appendiceal orifice and ileocecal valve. The                     colonoscopy was performed without difficulty. The patient                     tolerated the procedure well. The quality of the bowel                     preparation was adequate to identify polyps. Findings:      A small polyp was found in the ascending colon. The polyp was sessile.       The polyp was removed with a hot snare. Resection and retrieval were       complete.      A small polyp was found in the transverse colon. The polyp was sessile.       The polyp was removed with a hot snare. Resection and retrieval were       complete.      A small polyp was found at the hepatic flexure. The polyp was sessile.       The polyp  was removed with a hot snare. Resection and retrieval were       complete.      A diminutive polyp was found in the transverse colon. The polyp was  sessile. The polyp was removed with a hot snare. Resection and retrieval       were complete.      Three sessile polyps were found in the sigmoid colon. The polyps were       diminutive in size. These polyps were removed with a jumbo cold forceps.       Resection and retrieval were complete.      A small polyp was found in the recto-sigmoid colon. The polyp was       sessile. The polyp was removed with a hot snare. Resection and retrieval       were complete.      Internal hemorrhoids were found during endoscopy. The hemorrhoids were       small and Grade I (internal hemorrhoids that do not prolapse).      The exam was otherwise without abnormality. Impression:        - One small polyp in the ascending colon. Resected and                     retrieved.                    - One small polyp in the transverse colon. Resected and                     retrieved.                    - One small polyp at the hepatic flexure. Resected and                     retrieved.                    - One diminutive polyp in the transverse colon. Resected                     and retrieved.                    - Three diminutive polyps in the sigmoid colon. Resected                     and retrieved.                    - One small polyp at the recto-sigmoid colon. Resected and                     retrieved.                    - Internal hemorrhoids.                    - The examination was otherwise normal. Recommendation:    - Await pathology results. Manya Silvas, MD 10/28/2014 10:45:53 AM This report has been signed electronically. Number of Addenda: 0 Note Initiated On: 10/28/2014 9:51 AM Scope Withdrawal Time: 0 hours 28 minutes 26 seconds  Total Procedure Duration: 0 hours 33 minutes 25 seconds       [facility]

## 2014-10-29 ENCOUNTER — Encounter: Payer: Self-pay | Admitting: Unknown Physician Specialty

## 2014-10-29 LAB — SURGICAL PATHOLOGY

## 2014-11-09 ENCOUNTER — Encounter: Payer: Self-pay | Admitting: Internal Medicine

## 2014-11-16 ENCOUNTER — Encounter: Payer: Self-pay | Admitting: Unknown Physician Specialty

## 2014-11-16 NOTE — Transfer of Care (Addendum)
Immediate Anesthesia Transfer of Care Note  Patient: Katrina Martinez  Procedure(s) Performed: Procedure(s): COLONOSCOPY WITH PROPOFOL (N/A)  Patient Location: PACU and Endoscopy Unit  Anesthesia Type:General  Level of Consciousness: sedated  Airway & Oxygen Therapy: Patient Spontanous Breathing and Patient connected to nasal cannula oxygen  Post-op Assessment: Report given to RN and Post -op Vital signs reviewed and stable  Post vital signs: stable  Last Vitals:  Filed Vitals:   10/28/14 1119  BP: 134/86  Pulse: 68  Temp:   Resp: 24    Complications: No apparent anesthesia complications

## 2014-11-16 NOTE — Anesthesia Postprocedure Evaluation (Signed)
  Anesthesia Post-op Note  Patient: Katrina Martinez  Procedure(s) Performed: Procedure(s): COLONOSCOPY WITH PROPOFOL (N/A)  Anesthesia type:General  Patient location: PACU  Post pain: Pain level controlled  Post assessment: Post-op Vital signs reviewed, Patient's Cardiovascular Status Stable, Respiratory Function Stable, Patent Airway and No signs of Nausea or vomiting  Post vital signs: Reviewed and stable  Last Vitals:  Filed Vitals:   10/28/14 1119  BP: 134/86  Pulse: 68  Temp:   Resp: 24    Level of consciousness: awake, alert  and patient cooperative  Complications: No apparent anesthesia complications

## 2014-11-16 NOTE — Transfer of Care (Signed)
Immediate Anesthesia Transfer of Care Note  Patient: Katrina Martinez  Procedure(s) Performed: Procedure(s): COLONOSCOPY WITH PROPOFOL (N/A)  Patient Location: PACU and Endoscopy Unit  Anesthesia Type:General  Level of Consciousness: sedated  Airway & Oxygen Therapy: Patient Spontanous Breathing and Patient connected to nasal cannula oxygen  Post-op Assessment: Report given to RN and Post -op Vital signs reviewed and stable  Post vital signs: stable  Last Vitals:  Filed Vitals:   10/28/14 1119  BP: 134/86  Pulse: 68  Temp:   Resp: 24    Complications: No apparent anesthesia complications

## 2014-11-22 ENCOUNTER — Telehealth: Payer: Self-pay | Admitting: Internal Medicine

## 2014-11-22 DIAGNOSIS — D126 Benign neoplasm of colon, unspecified: Secondary | ICD-10-CM | POA: Insufficient documentation

## 2014-11-22 NOTE — Telephone Encounter (Signed)
Colonoscopy report receievd and chart updated

## 2015-01-05 ENCOUNTER — Telehealth: Payer: Self-pay | Admitting: *Deleted

## 2015-01-05 NOTE — Telephone Encounter (Signed)
Dr. Derrel Nip, please advise if you can see her?

## 2015-01-05 NOTE — Telephone Encounter (Signed)
Spoke with the patient, she was going to see if she could see her ENT or then go to the ER.  I told her to call if needed.

## 2015-01-05 NOTE — Telephone Encounter (Signed)
Patient requested to see only Dr. Derrel Nip, patient stated that she has diarrhea, swollen glands, cough, stiff neck and congestion. Please advise

## 2015-01-05 NOTE — Telephone Encounter (Signed)
4:30 Friday is the earliest appt I have.  If her neck is stiff she may have meningitis and needs to go to the ER asap

## 2015-01-12 ENCOUNTER — Telehealth: Payer: Self-pay | Admitting: *Deleted

## 2015-01-12 NOTE — Telephone Encounter (Signed)
Patient requested a medication refill for tussin ex, patient has a cough.

## 2015-01-17 NOTE — Telephone Encounter (Signed)
I do not refill cough medications that contain  controlled substances without an office visit,.

## 2015-01-17 NOTE — Telephone Encounter (Signed)
Pt needs appt

## 2015-01-17 NOTE — Telephone Encounter (Signed)
Maybe Tussin X?

## 2015-02-02 ENCOUNTER — Ambulatory Visit: Payer: 59 | Admitting: Pulmonary Disease

## 2015-02-02 ENCOUNTER — Ambulatory Visit (INDEPENDENT_AMBULATORY_CARE_PROVIDER_SITE_OTHER): Payer: 59 | Admitting: Pulmonary Disease

## 2015-02-02 ENCOUNTER — Encounter: Payer: Self-pay | Admitting: Pulmonary Disease

## 2015-02-02 ENCOUNTER — Encounter (INDEPENDENT_AMBULATORY_CARE_PROVIDER_SITE_OTHER): Payer: Self-pay

## 2015-02-02 VITALS — BP 128/70 | HR 75 | Wt 144.0 lb

## 2015-02-02 DIAGNOSIS — J410 Simple chronic bronchitis: Secondary | ICD-10-CM

## 2015-02-02 DIAGNOSIS — F17200 Nicotine dependence, unspecified, uncomplicated: Secondary | ICD-10-CM

## 2015-02-02 DIAGNOSIS — Z720 Tobacco use: Secondary | ICD-10-CM | POA: Diagnosis not present

## 2015-02-02 DIAGNOSIS — J441 Chronic obstructive pulmonary disease with (acute) exacerbation: Secondary | ICD-10-CM | POA: Diagnosis not present

## 2015-02-03 ENCOUNTER — Other Ambulatory Visit: Payer: Self-pay | Admitting: Internal Medicine

## 2015-02-03 MED ORDER — ALBUTEROL SULFATE HFA 108 (90 BASE) MCG/ACT IN AERS: 2.0000 | INHALATION_SPRAY | Freq: Four times a day (QID) | RESPIRATORY_TRACT | Status: AC | PRN

## 2015-02-03 NOTE — Progress Notes (Signed)
PROBLEM: COPD Smoker  INTERVAL HISTORY: Last seen by Dr Lake Bells in 2014 with diagnoses of COPD and smoker. She reports 4 courses of abx in past 4 months. Thus referred back to pulmonary medicine  SUBJ: Continues to smoke a few cigarettes per day. Has had persistent severe cough productive of clear mucus over past 3-4 months. She was started on Augmentin 12/12. Also prescribed a steroid inhaler (Flovent) and cough suppressant (Tussionex). Here to re-establish care with pulmoanry medicine   OBJ: Filed Vitals:   02/02/15 0932  BP: 128/70  Pulse: 75  Weight: 144 lb (65.318 kg)  SpO2: 98%   NAD HEENT WNL No JVD No wheezes  RRR s M NABS, soft No LE edema  BMP Latest Ref Rng 06/28/2014  Glucose 70 - 99 mg/dL 86  BUN 6 - 23 mg/dL 10  Creatinine 0.40 - 1.20 mg/dL 0.85  Sodium 135 - 145 mEq/L 138  Potassium 3.5 - 5.1 mEq/L 4.2  Chloride 96 - 112 mEq/L 102  CO2 19 - 32 mEq/L 28  Calcium 8.4 - 10.5 mg/dL 9.9    CBC Latest Ref Rng 06/28/2014 08/02/2011  WBC 4.0 - 10.5 K/uL 9.6 -  Hemoglobin 12.0 - 15.0 g/dL 15.4(H) 14.8  Hematocrit 36.0 - 46.0 % 44.8 -  Platelets 150.0 - 400.0 K/uL 255.0 -    CXR: No recent film   IMPRESSION: COPD, chronic bronchitis AECOPD - mild. Not currently wheezing. Just started augmentin Smoker  PLAN: Continue current medications Complete Augmentin course as prescribed Counseled in detail re: need for smoking cessation.I indicated that there is not much sense in escalating medical therapy for COPD if she continues to smoke ROV 4 weeks with PFTs and CXR   Wilhelmina Mcardle, MD [facility]

## 2015-02-04 ENCOUNTER — Telehealth: Payer: Self-pay | Admitting: *Deleted

## 2015-02-04 DIAGNOSIS — J441 Chronic obstructive pulmonary disease with (acute) exacerbation: Secondary | ICD-10-CM

## 2015-02-04 NOTE — Telephone Encounter (Signed)
-----   Message from Wilhelmina Mcardle, MD sent at 02/03/2015  9:44 PM EST ----- Please have her get a CXR prior to next visit

## 2015-02-04 NOTE — Telephone Encounter (Signed)
Pt informed to go and get CXR prior to next appt. Nothing further needed.

## 2015-03-02 ENCOUNTER — Ambulatory Visit (INDEPENDENT_AMBULATORY_CARE_PROVIDER_SITE_OTHER): Payer: Commercial Managed Care - HMO | Admitting: Family Medicine

## 2015-03-02 ENCOUNTER — Encounter: Payer: Self-pay | Admitting: Family Medicine

## 2015-03-02 VITALS — BP 154/84 | HR 94 | Temp 98.7°F | Ht 62.0 in | Wt 144.2 lb

## 2015-03-02 DIAGNOSIS — M3390 Dermatopolymyositis, unspecified, organ involvement unspecified: Secondary | ICD-10-CM | POA: Diagnosis not present

## 2015-03-02 DIAGNOSIS — R030 Elevated blood-pressure reading, without diagnosis of hypertension: Secondary | ICD-10-CM

## 2015-03-02 DIAGNOSIS — M797 Fibromyalgia: Secondary | ICD-10-CM | POA: Diagnosis not present

## 2015-03-02 MED ORDER — AMITRIPTYLINE HCL 10 MG PO TABS: 10.0000 mg | ORAL_TABLET | Freq: Every day | ORAL | Status: DC

## 2015-03-02 NOTE — Assessment & Plan Note (Signed)
Suspect the pain in her legs, neck, shoulders, and intermittent sharp chest discomfort is related to her fibromyalgia. She is not currently on any medications for this. She would like to start on a medicine for this. We will start her on amitriptyline. She has an allergy to [facility] which precluded use of nortriptyline. She has trialed Cymbalta previously with no benefit. He also requests a second opinion from a rheumatologist. We will refer her to rheumatology for her dermatomyositis. Given return precautions.

## 2015-03-02 NOTE — Assessment & Plan Note (Signed)
Followed by rheumatology. Wants a second opinion. Referral placed.

## 2015-03-02 NOTE — Patient Instructions (Addendum)
Nice to meet you. We will refer you to a new rheumatologist. We'll start her on amitriptyline for her fibromyalgia. Please monitor your blood pressure at home. Please return in 1-2 days for nurse visit to recheck her blood pressure. If you develop chest pain, shortness of breath, sweating, headache, vision changes, numbness, weakness, or any new or change in symptoms please seek medical attention.

## 2015-03-02 NOTE — Progress Notes (Signed)
Patient ID: Katrina Martinez, female   DOB: 1955-08-02, 60 y.o.   MRN: [id number]  Tommi Rumps, MD Phone: [phone]  Katrina Martinez is a 60 y.o. female who presents today for same-day visit.  Fibromyalgia: Patient notes long history of fibromyalgia and also has history of dermatomyositis. Is followed by rheumatology for this. Patient notes scattered areas of soreness and burning sensation. Most bothersome her the posterior aspects of her legs specifically in the area of the hamstring. Notes they are sore and burning. Also occur in her shoulders and neck. She was seen by rheumatologist yesterday. Note she has difficulty turning her neck due to discomfort. They did an x-ray yesterday. She has no calf or thigh swelling. She notes all these issues have been intermittently going on for the last 2-3 years. No numbness, tingling, or weakness. She is on Plaquenil for the last 6 months.  Elevated blood pressure: Patient notes this has intermittently been elevated for the last 6 months. On review of blood pressures they all appear to be in the normal range in our system and in care everywhere. She does note intermittent sharp central chest discomfort for the last 3-4 years. This does not radiate to her neck or her arms. She has no diaphoresis with this. Denies shortness of breath with this. It is not exertional. No history of VTE or leg swelling. Has seen cardiology previously for this and had a stress test late last year that revealed no ischemia. Echo with mild stenosis and no other changes. On review of records BP was normal yesterday at the rheumatologist's office. No Chest pain, shortness of breath, headache, vision changes, numbness, or weakness at this time. No edema at this time.  PMH: Smoker.   ROS see history of present illness  Objective  Physical Exam Filed Vitals:   03/02/15 1014 03/02/15 1015  BP: 160/82 154/84  Pulse:    Temp:     right arm BP 160/82 Left arm BP 154/84  BP  Readings from Last 3 Encounters:  03/02/15 154/84  02/02/15 128/70  10/28/14 134/86   Wt Readings from Last 3 Encounters:  03/02/15 144 lb 4 oz (65.431 kg)  02/02/15 144 lb (65.318 kg)  10/28/14 138 lb (62.596 kg)    Physical Exam  Constitutional: She is well-developed, well-nourished, and in no distress.  HENT:  Head: Normocephalic and atraumatic.  Right Ear: External ear normal.  Left Ear: External ear normal.  Mouth/Throat: Oropharynx is clear and moist. No oropharyngeal exudate.  Eyes: Conjunctivae are normal. Pupils are equal, round, and reactive to light.  Cardiovascular: Normal rate, regular rhythm and normal heart sounds.  Exam reveals no gallop and no friction rub.   No murmur heard. 2+ radial pulses  Pulmonary/Chest: Effort normal and breath sounds normal. No respiratory distress. She has no wheezes. She has no rales.  Musculoskeletal:  No midline spine tenderness or step-off, no muscular back tenderness, no neck midline tenderness or step off, no chest tenderness on palpation, minimal soreness of hamstrings, no swelling of the calves or hamstrings, no cords palpated in bilateral lower extremities, no buttocks tenderness  Neurological: She is alert.  5/5 strength in bilateral biceps, triceps, grip, quads, hamstrings, plantar and dorsiflexion, sensation to light touch intact in bilateral UE and LE, normal gait, 2+ patellar reflexes  Skin: Skin is warm and dry. She is not diaphoretic.     Assessment/Plan: Please see individual problem list.  Elevated blood pressure reading without diagnosis of hypertension Blood pressure elevated today.  Review of records indicates that it has been in the normal range in the past year. She is asymptomatic at this time. Suspect this is likely related to discomfort from her fibromyalgia. Sharp chest discomfort that she has had for last 3-4 years is likely related to her fibromyalgia. Unlikely related to cardiac cause given negative stress test  and a relatively benign echo in August of last year. Doubt vascular cause given intermittent long duration and equal blood pressures in both arms with stable heart rate. Doubt pulmonary cause given normal lung exam and stable oxygenation and heart rate. No history of VTE and oxygenation is normal with normal heart rate making this an unlikely cause of the pain. See fibromyalgia for discussion of treatment of pain. She will check her blood pressure at home. She is given parameters to call back to the office with. She will return in 2 days for blood pressure check with the nurse. She is given return precautions.  Fibromyalgia syndrome Suspect the pain in her legs, neck, shoulders, and intermittent sharp chest discomfort is related to her fibromyalgia. She is not currently on any medications for this. She would like to start on a medicine for this. We will start her on amitriptyline. She has an allergy to [facility] which precluded use of nortriptyline. She has trialed Cymbalta previously with no benefit. He also requests a second opinion from a rheumatologist. We will refer her to rheumatology for her dermatomyositis. Given return precautions.  Dermatomyositis ([place]) Followed by rheumatology. Wants a second opinion. Referral placed.    Orders Placed This Encounter  Procedures  . Ambulatory referral to Rheumatology    Referral Priority:  Routine    Referral Type:  Consultation    Referral Reason:  Specialty Services Required    Requested Specialty:  Rheumatology    Number of Visits Requested:  1    Meds ordered this encounter  Medications  . amitriptyline (ELAVIL) 10 MG tablet    Sig: Take 1 tablet (10 mg total) by mouth at bedtime.    Dispense:  30 tablet    Refill:  1   Dragon voice recognition software was used during the dictation process of this note. If any phrases or words seem inappropriate it is likely secondary to the translation process being inefficient.  Tommi Rumps

## 2015-03-02 NOTE — Progress Notes (Signed)
Pre visit review using our clinic review tool, if applicable. No additional management support is needed unless otherwise documented below in the visit note. 

## 2015-03-02 NOTE — Assessment & Plan Note (Addendum)
Blood pressure elevated today. Review of records indicates that it has been in the normal range in the past year. She is asymptomatic at this time. Suspect this is likely related to discomfort from her fibromyalgia. Sharp chest discomfort that she has had for last 3-4 years is atypical and is likely related to her fibromyalgia. Unlikely related to cardiac cause given negative stress test and a relatively benign echo in August of last year. Doubt vascular cause given intermittent long duration of pain and equal blood pressures in both arms with stable heart rate. Doubt pulmonary cause given normal lung exam and stable oxygenation and heart rate. No history of VTE and oxygenation is normal with normal heart rate making this an unlikely cause of the pain. See fibromyalgia for discussion of treatment of pain. She will check her blood pressure at home. She is given parameters to call back to the office with. She will return in 2 days for blood pressure check with the nurse. She is given return precautions.

## 2015-03-03 ENCOUNTER — Ambulatory Visit: Payer: Commercial Managed Care - HMO

## 2015-03-03 ENCOUNTER — Telehealth: Payer: Self-pay

## 2015-03-03 VITALS — BP 132/82 | HR 92 | Resp 12

## 2015-03-03 DIAGNOSIS — I10 Essential (primary) hypertension: Secondary | ICD-10-CM

## 2015-03-03 NOTE — Telephone Encounter (Signed)
Patient came in to ask Dr. Derrel Nip for pain medication.  Patient had a Neck Xray completed yesterday at [facility] per Rheumatoid.  She was given a muscle relaxant and that is not working.  She wanted to make sure that we received her results.  I attempted to view them in Care Everywhere, only able to see encounter no results.  [place] Team lead advised that she needs to follow up with the Dr. Parks Ranger ordered the test.  I would let Dr. Derrel Nip know that it was done.  Please advise?

## 2015-03-03 NOTE — Telephone Encounter (Signed)
No rx.  Agree with plan

## 2015-03-07 ENCOUNTER — Ambulatory Visit: Payer: Commercial Managed Care - HMO | Admitting: Pulmonary Disease

## 2015-05-02 ENCOUNTER — Encounter: Payer: Self-pay | Admitting: Internal Medicine

## 2015-05-02 ENCOUNTER — Other Ambulatory Visit: Payer: Self-pay | Admitting: Family Medicine

## 2015-05-02 ENCOUNTER — Ambulatory Visit (INDEPENDENT_AMBULATORY_CARE_PROVIDER_SITE_OTHER): Payer: Commercial Managed Care - HMO | Admitting: Internal Medicine

## 2015-05-02 VITALS — BP 144/80 | HR 89 | Temp 98.2°F | Resp 12 | Ht 62.0 in | Wt 153.5 lb

## 2015-05-02 DIAGNOSIS — M79629 Pain in unspecified upper arm: Secondary | ICD-10-CM

## 2015-05-02 DIAGNOSIS — G8929 Other chronic pain: Secondary | ICD-10-CM

## 2015-05-02 DIAGNOSIS — Z87891 Personal history of nicotine dependence: Secondary | ICD-10-CM

## 2015-05-02 DIAGNOSIS — I2583 Coronary atherosclerosis due to lipid rich plaque: Secondary | ICD-10-CM

## 2015-05-02 DIAGNOSIS — M797 Fibromyalgia: Secondary | ICD-10-CM

## 2015-05-02 DIAGNOSIS — I2510 Atherosclerotic heart disease of native coronary artery without angina pectoris: Secondary | ICD-10-CM | POA: Diagnosis not present

## 2015-05-02 DIAGNOSIS — I700 Atherosclerosis of aorta: Secondary | ICD-10-CM

## 2015-05-02 DIAGNOSIS — M25529 Pain in unspecified elbow: Secondary | ICD-10-CM

## 2015-05-02 MED ORDER — PREGABALIN 75 MG PO CAPS: 75.0000 mg | ORAL_CAPSULE | Freq: Two times a day (BID) | ORAL | Status: DC

## 2015-05-02 NOTE — Patient Instructions (Addendum)
We made the following changes/suggestions :    Replace Jergens' moisturizer with Euceril  For twice daily use.   Starting Lyrica for your joint/nerve pain .  Start with the evening dose  Add the morning dose after a few days   We can increase the dose if needed over time   I'm glad you are trying to stop smoking  I am concerned about your risk for heart attach and stroke because of the atherosclerosis noted on your CT scan last year  Please return for fasting labs   Please consider joining a gym that has a heated pool for regular exercise

## 2015-05-02 NOTE — Progress Notes (Signed)
Pre-visit discussion using our clinic review tool. No additional management support is needed unless otherwise documented below in the visit note.  

## 2015-05-02 NOTE — Telephone Encounter (Signed)
RX request for amitriptyline. Dr. Caryl Bis prescribed in 02/2015. Please advise if you would like to refill. You seen patient 04/2015

## 2015-05-02 NOTE — Progress Notes (Signed)
Subjective:  Patient ID: Katrina Martinez, female    DOB: November 30, 1955  Age: 60 y.o. MRN: [id number]  CC: The primary encounter diagnosis was Coronary atherosclerosis due to lipid rich plaque. Diagnoses of Pain in joint, upper arm, unspecified laterality, Chronic pain, Fibromyalgia syndrome, History of tobacco abuse, and Aortic atherosclerosis ([place]) were also pertinent to this visit.  HPI Katrina Martinez presents for follow up on chronic pain involving joints and muscles. .  Since her last visit with me in June she has undergone multiple evaluations   Underwent stress testing for chest pain,  Aortic stenosis , mild was noted along with atherosclerosis .  Test negative for ischemia ([place])  Joint pain, dry skin, dry eye: diagnosed with Sjogren's ,  Confirmed with biopsy by ENT and by dermatology.  Presented with parotid gland swelling and dermatomyositis  . Seeing Precious Reel for Rheumatology .  Taking Plaquenil and Elavil for FM not helping, .  Skin still blotchy and itchy. Using "everything under the sun" including Jergen's (bad choice) . Says her joints are still bothering her a lot.  Knees, elbows,  Finger joints,  And neck joints.  She is dissatisfied and frustated by persistnet symptoms of joint pain,  Dry skin,  And  is in the process of getting a second opinion from [facility] ordered by ES.    Dermatomyositis diagnosed with skin biopsy by Nehemiah Massed left forearm June 2016  Had CT scan in August for LLQ pain and diarrhea. Osis without itis noted and . Aortoiliac calcifications noted.    Tobacco abuse:  Has reduced her smoking to several days per week,  Not daily.  Smokes only when stressed out.   Outpatient Prescriptions Prior to Visit  Medication Sig Dispense Refill  . albuterol (PROVENTIL HFA) 108 (90 BASE) MCG/ACT inhaler     . albuterol (PROVENTIL HFA;VENTOLIN HFA) 108 (90 BASE) MCG/ACT inhaler Inhale 2 puffs into the lungs every 6 (six) hours as needed for wheezing or  shortness of breath. 3.7 g 11  . hydroxychloroquine (PLAQUENIL) 200 MG tablet Take by mouth.    . fluticasone (FLOVENT HFA) 44 MCG/ACT inhaler Reported on 05/02/2015    . triamcinolone cream (KENALOG) 0.1 % Reported on 05/02/2015  1  . amitriptyline (ELAVIL) 10 MG tablet Take 1 tablet (10 mg total) by mouth at bedtime. (Patient not taking: Reported on 05/02/2015) 30 tablet 1  . amoxicillin-clavulanate (AUGMENTIN) 875-125 MG tablet Take by mouth.    . chlorpheniramine-HYDROcodone (TUSSIONEX PENNKINETIC ER) 10-8 MG/5ML SUER Take 5 mLs by mouth at bedtime as needed for cough. 140 mL 0  . ciclesonide (OMNARIS) 50 MCG/ACT nasal spray Place 2 sprays into both nostrils daily. As needed    . hydroxychloroquine (PLAQUENIL) 200 MG tablet Take by mouth.     No facility-administered medications prior to visit.    Review of Systems;  Patient denies headache, fevers, malaise, unintentional weight loss, skin rash, eye pain, sinus congestion and sinus pain, sore throat, dysphagia,  hemoptysis , cough, dyspnea, wheezing, chest pain, palpitations, orthopnea, edema, abdominal pain, nausea, melena, diarrhea, constipation, flank pain, dysuria, hematuria, urinary  Frequency, nocturia, numbness, tingling, seizures,  Focal weakness, Loss of consciousness,  Tremor, insomnia, depression, anxiety, and suicidal ideation.      Objective:  BP 144/80 mmHg  Pulse 89  Temp(Src) 98.2 F (36.8 C) (Oral)  Resp 12  Ht '5\' 2"'  (1.575 m)  Wt 153 lb 8 oz (69.627 kg)  BMI 28.07 kg/m2  SpO2 96%  BP Readings  from Last 3 Encounters:  05/02/15 144/80  03/03/15 132/82  03/02/15 154/84    Wt Readings from Last 3 Encounters:  05/02/15 153 lb 8 oz (69.627 kg)  03/02/15 144 lb 4 oz (65.431 kg)  02/02/15 144 lb (65.318 kg)    General appearance: alert, cooperative and appears stated age Ears: normal TM's and external ear canals both ears Throat: lips, mucosa, and tongue normal; teeth and gums normal Neck: no adenopathy, no  carotid bruit, supple, symmetrical, trachea midline and thyroid not enlarged, symmetric, no tenderness/mass/nodules Back: symmetric, no curvature. ROM normal. No CVA tenderness. Lungs: clear to auscultation bilaterally Heart: regular rate and rhythm, S1, S2 normal, no murmur, click, rub or gallop Abdomen: soft, non-tender; bowel sounds normal; no masses,  no organomegaly Pulses: 2+ and symmetric Skin: Skin color, texture, turgor normal. No rashes or lesions Lymph nodes: Cervical, supraclavicular, and axillary nodes normal.  No results found for: HGBA1C  Lab Results  Component Value Date   CREATININE 0.85 06/28/2014    Lab Results  Component Value Date   WBC 9.6 06/28/2014   HGB 15.4* 06/28/2014   HCT 44.8 06/28/2014   PLT 255.0 06/28/2014   GLUCOSE 86 06/28/2014   ALT 25 06/28/2014   AST 21 06/28/2014   NA 138 06/28/2014   K 4.2 06/28/2014   CL 102 06/28/2014   CREATININE 0.85 06/28/2014   BUN 10 06/28/2014   CO2 28 06/28/2014    No results found.  Assessment & Plan:   Problem List Items Addressed This Visit    History of tobacco abuse    She reportedly quit in 2014 but occasionally smokes .  Spent 3 minutes discussing risk of continued tobacco abuse, given her atherosclerosis on recent CT including but not limited to CAD, PAD, hypertension, and CA.  He is not interested in pharmacotherapy at this time.      Fibromyalgia syndrome    Trial of Lyrica, since Elavil caused worsening dry mouth . Encouraged to start a water aerobics exercise program since she is not exercising and has gained weight.       Chronic pain    She is not tolerating elavil trial due to aggravating  Sjogren's symptoms of dry mouth . Many of her joint complaints appear to be OA.  Checking ESR.   Trial of Lyrica.       Relevant Medications   pregabalin (LYRICA) 75 MG capsule   Aortic atherosclerosis ([place])    Noted on CT.  Return for fasting lipids and statin therapy advised if appropriate.  No  results found for: CHOL, HDL, LDLCALC, LDLDIRECT, TRIG, CHOLHDL'       Other Visit Diagnoses    Coronary atherosclerosis due to lipid rich plaque    -  Primary    Relevant Orders    Lipid panel    Pain in joint, upper arm, unspecified laterality        Relevant Orders    Sedimentation rate    Comprehensive metabolic panel      A total of 40 minutes was spent with patient more than half of which was spent in counseling patient on the above mentioned issues , reviewing and explaining recent labs and imaging studies done, and coordination of care.  I have discontinued Ms. Aldava's ciclesonide, chlorpheniramine-HYDROcodone, amoxicillin-clavulanate, and amitriptyline. I am also having her start on pregabalin. Additionally, I am having her maintain her triamcinolone cream, hydroxychloroquine, albuterol, fluticasone, and albuterol.  Meds ordered this encounter  Medications  . pregabalin (LYRICA)  75 MG capsule    Sig: Take 1 capsule (75 mg total) by mouth 2 (two) times daily.    Dispense:  60 capsule    Refill:  2    Medications Discontinued During This Encounter  Medication Reason  . amoxicillin-clavulanate (AUGMENTIN) 875-125 MG tablet Completed Course  . ciclesonide (OMNARIS) 50 MCG/ACT nasal spray Completed Course  . chlorpheniramine-HYDROcodone (TUSSIONEX PENNKINETIC ER) 10-8 MG/5ML SUER Completed Course  . hydroxychloroquine (PLAQUENIL) 585 MG tablet Duplicate  . amitriptyline (ELAVIL) 10 MG tablet     Follow-up: No Follow-up on file.   Crecencio Mc, MD

## 2015-05-03 DIAGNOSIS — G8929 Other chronic pain: Secondary | ICD-10-CM | POA: Insufficient documentation

## 2015-05-03 DIAGNOSIS — I700 Atherosclerosis of aorta: Secondary | ICD-10-CM | POA: Insufficient documentation

## 2015-05-03 NOTE — Assessment & Plan Note (Addendum)
Trial of Lyrica, since Elavil caused worsening dry mouth . Encouraged to start a water aerobics exercise program since she is not exercising and has gained weight.

## 2015-05-03 NOTE — Telephone Encounter (Signed)
Medication was changed to lyrica at yesterday's visit

## 2015-05-03 NOTE — Assessment & Plan Note (Signed)
Noted on CT.  Return for fasting lipids and statin therapy advised if appropriate.  No results found for: CHOL, HDL, LDLCALC, LDLDIRECT, TRIG, CHOLHDL'

## 2015-05-03 NOTE — Assessment & Plan Note (Signed)
She is not tolerating elavil trial due to aggravating  Sjogren's symptoms of dry mouth . Many of her joint complaints appear to be OA.  Checking ESR.   Trial of Lyrica.

## 2015-05-03 NOTE — Assessment & Plan Note (Signed)
She reportedly quit in 2014 but occasionally smokes .  Spent 3 minutes discussing risk of continued tobacco abuse, given her atherosclerosis on recent CT including but not limited to CAD, PAD, hypertension, and CA.  He is not interested in pharmacotherapy at this time.

## 2015-05-04 ENCOUNTER — Other Ambulatory Visit: Payer: Commercial Managed Care - HMO

## 2015-05-06 ENCOUNTER — Other Ambulatory Visit (INDEPENDENT_AMBULATORY_CARE_PROVIDER_SITE_OTHER): Payer: Commercial Managed Care - HMO

## 2015-05-06 DIAGNOSIS — M25529 Pain in unspecified elbow: Secondary | ICD-10-CM

## 2015-05-06 DIAGNOSIS — M79629 Pain in unspecified upper arm: Secondary | ICD-10-CM

## 2015-05-06 DIAGNOSIS — I2510 Atherosclerotic heart disease of native coronary artery without angina pectoris: Secondary | ICD-10-CM

## 2015-05-06 DIAGNOSIS — I2583 Coronary atherosclerosis due to lipid rich plaque: Secondary | ICD-10-CM

## 2015-05-06 DIAGNOSIS — R002 Palpitations: Secondary | ICD-10-CM | POA: Diagnosis not present

## 2015-05-06 DIAGNOSIS — Z79899 Other long term (current) drug therapy: Secondary | ICD-10-CM

## 2015-05-06 LAB — COMPREHENSIVE METABOLIC PANEL
ALBUMIN: 4.4 g/dL (ref 3.5–5.2)
ALK PHOS: 81 U/L (ref 39–117)
ALT: 20 U/L (ref 0–35)
AST: 19 U/L (ref 0–37)
BUN: 11 mg/dL (ref 6–23)
CALCIUM: 9.3 mg/dL (ref 8.4–10.5)
CO2: 25 mEq/L (ref 19–32)
CREATININE: 0.87 mg/dL (ref 0.40–1.20)
Chloride: 106 mEq/L (ref 96–112)
GFR: 70.6 mL/min (ref >60.00)
Glucose, Bld: 95 mg/dL (ref 70–99)
POTASSIUM: 4 meq/L (ref 3.5–5.1)
SODIUM: 141 meq/L (ref 135–145)
TOTAL PROTEIN: 6.9 g/dL (ref 6.0–8.3)
Total Bilirubin: 0.6 mg/dL (ref 0.2–1.2)

## 2015-05-06 LAB — LIPID PANEL
CHOL/HDL RATIO: 5
Cholesterol: 249 mg/dL — ABNORMAL HIGH (ref 0–200)
HDL: 50.9 mg/dL (ref >39.00)
LDL CALC: 179 mg/dL — AB (ref 0–99)
NONHDL: 197.66
TRIGLYCERIDES: 94 mg/dL (ref 0.0–149.0)
VLDL: 18.8 mg/dL (ref 0.0–40.0)

## 2015-05-06 LAB — TSH: TSH: 1.26 u[IU]/mL (ref 0.35–4.50)

## 2015-05-06 LAB — SEDIMENTATION RATE: Sed Rate: 12 mm/hr (ref 0–22)

## 2015-05-10 MED ORDER — ATORVASTATIN CALCIUM 20 MG PO TABS: 20.0000 mg | ORAL_TABLET | Freq: Every day | ORAL | Status: DC

## 2015-05-10 NOTE — Addendum Note (Signed)
Addended by: Crecencio Mc on: 05/10/2015 11:08 AM   Modules accepted: Orders

## 2015-05-28 ENCOUNTER — Other Ambulatory Visit: Payer: Self-pay | Admitting: Family Medicine

## 2015-05-30 NOTE — Telephone Encounter (Signed)
refilled 

## 2015-05-30 NOTE — Telephone Encounter (Signed)
Please advise on refill.

## 2015-06-21 ENCOUNTER — Other Ambulatory Visit: Payer: Commercial Managed Care - HMO

## 2015-07-25 ENCOUNTER — Other Ambulatory Visit: Payer: Self-pay | Admitting: Rheumatology

## 2015-07-25 DIAGNOSIS — M3390 Dermatopolymyositis, unspecified, organ involvement unspecified: Secondary | ICD-10-CM

## 2015-07-25 DIAGNOSIS — R1310 Dysphagia, unspecified: Secondary | ICD-10-CM

## 2015-07-29 ENCOUNTER — Ambulatory Visit: Payer: Commercial Managed Care - HMO

## 2015-08-01 ENCOUNTER — Ambulatory Visit
Admission: RE | Admit: 2015-08-01 | Discharge: 2015-08-01 | Disposition: A | Discharge status: Home / Self Care | Payer: Commercial Managed Care - HMO | Source: Ambulatory Visit | Attending: Rheumatology | Admitting: Rheumatology

## 2015-08-01 DIAGNOSIS — K449 Diaphragmatic hernia without obstruction or gangrene: Secondary | ICD-10-CM | POA: Insufficient documentation

## 2015-08-01 DIAGNOSIS — M3390 Dermatopolymyositis, unspecified, organ involvement unspecified: Secondary | ICD-10-CM | POA: Diagnosis present

## 2015-08-01 DIAGNOSIS — K224 Dyskinesia of esophagus: Secondary | ICD-10-CM | POA: Diagnosis not present

## 2015-08-01 DIAGNOSIS — R1310 Dysphagia, unspecified: Secondary | ICD-10-CM | POA: Insufficient documentation

## 2015-08-16 ENCOUNTER — Other Ambulatory Visit: Payer: Self-pay | Admitting: Internal Medicine

## 2015-08-17 ENCOUNTER — Other Ambulatory Visit: Payer: Self-pay | Admitting: Internal Medicine

## 2015-09-16 ENCOUNTER — Encounter: Payer: Self-pay | Admitting: *Deleted

## 2015-09-19 ENCOUNTER — Ambulatory Visit: Payer: Commercial Managed Care - HMO | Admitting: Anesthesiology

## 2015-09-19 ENCOUNTER — Encounter: Payer: Self-pay | Admitting: *Deleted

## 2015-09-19 ENCOUNTER — Encounter
Admission: RE | Disposition: A | Discharge status: Home Health | Payer: Self-pay | Source: Ambulatory Visit | Attending: Unknown Physician Specialty

## 2015-09-19 ENCOUNTER — Ambulatory Visit
Admission: RE | Admit: 2015-09-19 | Discharge: 2015-09-19 | Disposition: A | Discharge status: Home Health | Payer: Commercial Managed Care - HMO | Source: Ambulatory Visit | Attending: Unknown Physician Specialty | Admitting: Unknown Physician Specialty

## 2015-09-19 DIAGNOSIS — M797 Fibromyalgia: Secondary | ICD-10-CM | POA: Diagnosis not present

## 2015-09-19 DIAGNOSIS — F419 Anxiety disorder, unspecified: Secondary | ICD-10-CM | POA: Insufficient documentation

## 2015-09-19 DIAGNOSIS — M3500 Sicca syndrome, unspecified: Secondary | ICD-10-CM | POA: Diagnosis not present

## 2015-09-19 DIAGNOSIS — I10 Essential (primary) hypertension: Secondary | ICD-10-CM | POA: Diagnosis not present

## 2015-09-19 DIAGNOSIS — N6019 Diffuse cystic mastopathy of unspecified breast: Secondary | ICD-10-CM | POA: Insufficient documentation

## 2015-09-19 DIAGNOSIS — K210 Gastro-esophageal reflux disease with esophagitis: Secondary | ICD-10-CM | POA: Diagnosis not present

## 2015-09-19 DIAGNOSIS — R011 Cardiac murmur, unspecified: Secondary | ICD-10-CM | POA: Diagnosis not present

## 2015-09-19 DIAGNOSIS — M1990 Unspecified osteoarthritis, unspecified site: Secondary | ICD-10-CM | POA: Diagnosis not present

## 2015-09-19 DIAGNOSIS — Z91040 Latex allergy status: Secondary | ICD-10-CM | POA: Diagnosis not present

## 2015-09-19 DIAGNOSIS — Z79899 Other long term (current) drug therapy: Secondary | ICD-10-CM | POA: Insufficient documentation

## 2015-09-19 DIAGNOSIS — F17210 Nicotine dependence, cigarettes, uncomplicated: Secondary | ICD-10-CM | POA: Insufficient documentation

## 2015-09-19 DIAGNOSIS — I350 Nonrheumatic aortic (valve) stenosis: Secondary | ICD-10-CM | POA: Insufficient documentation

## 2015-09-19 DIAGNOSIS — K2950 Unspecified chronic gastritis without bleeding: Secondary | ICD-10-CM | POA: Insufficient documentation

## 2015-09-19 DIAGNOSIS — K259 Gastric ulcer, unspecified as acute or chronic, without hemorrhage or perforation: Secondary | ICD-10-CM | POA: Diagnosis not present

## 2015-09-19 DIAGNOSIS — E785 Hyperlipidemia, unspecified: Secondary | ICD-10-CM | POA: Diagnosis not present

## 2015-09-19 DIAGNOSIS — J449 Chronic obstructive pulmonary disease, unspecified: Secondary | ICD-10-CM | POA: Diagnosis not present

## 2015-09-19 DIAGNOSIS — R1310 Dysphagia, unspecified: Secondary | ICD-10-CM | POA: Diagnosis present

## 2015-09-19 HISTORY — PX: SAVORY DILATION: SHX5439

## 2015-09-19 HISTORY — PX: ESOPHAGOGASTRODUODENOSCOPY (EGD) WITH PROPOFOL: SHX5813

## 2015-09-19 SURGERY — ESOPHAGOGASTRODUODENOSCOPY (EGD) WITH PROPOFOL
Anesthesia: General

## 2015-09-19 MED ORDER — LIDOCAINE HCL (PF) 2 % IJ SOLN
INTRAMUSCULAR | Status: DC | PRN
  Administered 2015-09-19: 50 mg

## 2015-09-19 MED ORDER — SODIUM CHLORIDE 0.9 % IV SOLN: INTRAVENOUS | Status: DC

## 2015-09-19 MED ORDER — GLYCOPYRROLATE 0.2 MG/ML IJ SOLN
INTRAMUSCULAR | Status: DC | PRN
  Administered 2015-09-19: 0.2 mg via INTRAVENOUS

## 2015-09-19 MED ORDER — PROPOFOL 10 MG/ML IV BOLUS
INTRAVENOUS | Status: DC | PRN
  Administered 2015-09-19 (×2): 30 mg via INTRAVENOUS
  Administered 2015-09-19: 50 mg via INTRAVENOUS
  Administered 2015-09-19: 20 mg via INTRAVENOUS

## 2015-09-19 MED ORDER — MIDAZOLAM HCL 5 MG/5ML IJ SOLN
INTRAMUSCULAR | Status: DC | PRN
  Administered 2015-09-19 (×2): 1 mg via INTRAVENOUS

## 2015-09-19 MED ORDER — SODIUM CHLORIDE 0.9 % IV SOLN
INTRAVENOUS | Status: DC
Start: 2015-09-19 — End: 2015-09-19
  Administered 2015-09-19: 10:00:00 via INTRAVENOUS

## 2015-09-19 MED ORDER — PROPOFOL 500 MG/50ML IV EMUL
INTRAVENOUS | Status: DC | PRN
  Administered 2015-09-19: 100 ug/kg/min via INTRAVENOUS

## 2015-09-19 MED ORDER — FENTANYL CITRATE (PF) 100 MCG/2ML IJ SOLN
INTRAMUSCULAR | Status: DC | PRN
  Administered 2015-09-19: 50 ug via INTRAVENOUS

## 2015-09-19 NOTE — H&P (Signed)
Primary Care Physician:  Crecencio Mc, MD Primary Gastroenterologist:  Dr. Vira Agar  Pre-Procedure History & Physical: HPI:  Katrina Martinez is a 60 y.o. female is here for an endoscopy.   Past Medical History:  Diagnosis Date  . Anxiety   . Arthritis   . Arthritis   . Dermatomyositis ([place])   . Fibrocystic breast disease in female    annual mammograms Feb 2012 , [place]  . Fibromyalgia    did not tolerate Cymbalta trial (per records)  . Headache(784.0)   . Heart murmur    echo do 8 hr ago-arh  . Hyperlipidemia   . Sjogren's syndrome ([place])   . Tobacco abuse     Past Surgical History:  Procedure Laterality Date  . ABDOMINAL HYSTERECTOMY    . APPENDECTOMY    . COLONOSCOPY WITH PROPOFOL N/A 10/28/2014   Procedure: COLONOSCOPY WITH PROPOFOL;  Surgeon: Manya Silvas, MD;  Location[facility] ENDOSCOPY;  Service: Endoscopy;  Laterality: N/A;  . [place]arch sling    . OOPHORECTOMY     bilateral  . TONSILLECTOMY    . TUBAL LIGATION      Prior to Admission medications   Medication Sig Start Date End Date Taking? Authorizing Provider  cyclobenzaprine (FLEXERIL) 5 MG tablet Take 5 mg by mouth 3 (three) times daily as needed for muscle spasms.   Yes Historical Provider, MD  folic acid (FOLVITE) 1 MG tablet Take 1 mg by mouth daily.   Yes Historical Provider, MD  hydroxychloroquine (PLAQUENIL) 200 MG tablet Take by mouth daily.   Yes Historical Provider, MD  methotrexate (RHEUMATREX) 2.5 MG tablet Take 24 mg by mouth once a week.   Yes Historical Provider, MD  albuterol (PROVENTIL HFA) 108 (90 BASE) MCG/ACT inhaler     Historical Provider, MD  albuterol (PROVENTIL HFA;VENTOLIN HFA) 108 (90 BASE) MCG/ACT inhaler Inhale 2 puffs into the lungs every 6 (six) hours as needed for wheezing or shortness of breath. 02/03/15   Crecencio Mc, MD  amitriptyline (ELAVIL) 10 MG tablet TAKE 1 TABLET (10 MG TOTAL) BY MOUTH AT BEDTIME. 05/30/15   Crecencio Mc, MD  amitriptyline (ELAVIL) 10 MG tablet  TAKE 1 TABLET (10 MG TOTAL) BY MOUTH AT BEDTIME. 08/17/15   Crecencio Mc, MD  atorvastatin (LIPITOR) 20 MG tablet TAKE 1 TABLET (20 MG TOTAL) BY MOUTH DAILY. 08/16/15   Crecencio Mc, MD  fluticasone (FLOVENT HFA) 44 MCG/ACT inhaler Reported on 05/02/2015 01/31/15   Historical Provider, MD  hydroxychloroquine (PLAQUENIL) 200 MG tablet Take by mouth. 09/24/14   Historical Provider, MD  pregabalin (LYRICA) 75 MG capsule Take 1 capsule (75 mg total) by mouth 2 (two) times daily. 05/02/15   Crecencio Mc, MD  triamcinolone cream (KENALOG) 0.1 % Reported on 05/02/2015 09/01/14   Historical Provider, MD    Allergies as of 09/16/2015 - Review Complete 09/16/2015  Allergen Reaction Noted  . Benzonatate Nausea Only 03/13/2011  . Hydrocortisone Other (See Comments) 09/24/2014  . Latex  01/03/2011  . Neosporin [neomycin-bacitracin zn-polymyx]  01/03/2011  . Polysporin [bacitracin-polymyxin b]  02/02/2011  . Hydrogen peroxide Rash 02/02/2011    Family History  Problem Relation Age of Onset  . Diabetes Mother   . Hypertension Mother   . Heart disease Father   . Hyperlipidemia Father   . Diabetes Brother     Social History   Social History  . Marital status: Married    Spouse name: N/A  . Number of children: N/A  .  Years of education: N/A   Occupational History  . Not on file.   Social History Main Topics  . Smoking status: Current Some Day Smoker    Packs/day: 0.25    Years: 32.00    Types: Cigarettes  . Smokeless tobacco: Never Used     Comment: pt has cut back on smoking . she states she smokes one cigarette a day two at the most-last reviewed 03/03/12  . Alcohol use No  . Drug use: No  . Sexual activity: Not on file   Other Topics Concern  . Not on file   Social History Narrative  . No narrative on file    Review of Systems: See HPI, otherwise negative ROS  Physical Exam: BP (!) 180/97   Pulse 72   Temp 97 F (36.1 C) (Tympanic)   Resp 14   Ht 5\' 6"  (1.676 m)   Wt  68 kg (150 lb)   SpO2 100%   BMI 24.21 kg/m  General:   Alert,  pleasant and cooperative in NAD Head:  Normocephalic and atraumatic. Neck:  Supple; no masses or thyromegaly. Lungs:  Clear throughout to auscultation.    Heart:  Regular rate and rhythm. Abdomen:  Soft, nontender and nondistended. Normal bowel sounds, without guarding, and without rebound.   Neurologic:  Alert and  oriented x4;  grossly normal neurologically.  Impression/Plan: Katrina Martinez is here for an endoscopy to be performed for dysphagia  Risks, benefits, limitations, and alternatives regarding  endoscopy have been reviewed with the patient.  Questions have been answered.  All parties agreeable.   Gaylyn Cheers, MD  09/19/2015, 11:15 AM

## 2015-09-19 NOTE — Anesthesia Preprocedure Evaluation (Signed)
Anesthesia Evaluation  Patient identified by MRN, date of birth, ID band Patient awake    Reviewed: Allergy & Precautions, NPO status , Patient's Chart, lab work & pertinent test results  Airway Mallampati: II       Dental  (+) Caps, Teeth Intact   Pulmonary COPD, Current Smoker,     + decreased breath sounds      Cardiovascular Exercise Tolerance: Good hypertension,  Rhythm:Regular + Systolic murmurs Aortic Stenosis   Neuro/Psych  Headaches, Anxiety    GI/Hepatic negative GI ROS, Neg liver ROS,   Endo/Other  negative endocrine ROS  Renal/GU negative Renal ROS     Musculoskeletal   Abdominal Normal abdominal exam  (+)   Peds  Hematology negative hematology ROS (+)   Anesthesia Other Findings   Reproductive/Obstetrics                             Anesthesia Physical Anesthesia Plan  ASA: III  Anesthesia Plan: General   Post-op Pain Management:    Induction: Intravenous  Airway Management Planned: Natural Airway and Nasal Cannula  Additional Equipment:   Intra-op Plan:   Post-operative Plan:   Informed Consent: I have reviewed the patients History and Physical, chart, labs and discussed the procedure including the risks, benefits and alternatives for the proposed anesthesia with the patient or authorized representative who has indicated his/her understanding and acceptance.     Plan Discussed with: CRNA  Anesthesia Plan Comments:         Anesthesia Quick Evaluation

## 2015-09-19 NOTE — Transfer of Care (Signed)
Immediate Anesthesia Transfer of Care Note  Patient: Katrina Martinez  Procedure(s) Performed: Procedure(s): ESOPHAGOGASTRODUODENOSCOPY (EGD) WITH PROPOFOL (N/A) SAVORY DILATION (N/A)  Patient Location: PACU  Anesthesia Type:General  Level of Consciousness: sedated  Airway & Oxygen Therapy: Patient Spontanous Breathing and Patient connected to nasal cannula oxygen  Post-op Assessment: Report given to RN and Post -op Vital signs reviewed and stable  Post vital signs: Reviewed and stable  Last Vitals:  Vitals:   09/19/15 1016  BP: (!) 180/97  Pulse: 72  Resp: 14  Temp: 36.1 C    Last Pain:  Vitals:   09/19/15 1016  TempSrc: Tympanic         Complications: No apparent anesthesia complications

## 2015-09-19 NOTE — Anesthesia Postprocedure Evaluation (Signed)
Anesthesia Post Note  Patient: SHAVAWN GAUTNEY  Procedure(s) Performed: Procedure(s) (LRB): ESOPHAGOGASTRODUODENOSCOPY (EGD) WITH PROPOFOL (N/A) SAVORY DILATION (N/A)  Patient location during evaluation: PACU Anesthesia Type: General Level of consciousness: awake Pain management: pain level controlled Vital Signs Assessment: post-procedure vital signs reviewed and stable Respiratory status: spontaneous breathing Cardiovascular status: stable Postop Assessment: no headache Anesthetic complications: no    Last Vitals:  Vitals:   09/19/15 1016 09/19/15 1134  BP: (!) 180/97 128/67  Pulse: 72 99  Resp: 14 (!) 24  Temp: 36.1 C (!) 36 C    Last Pain:  Vitals:   09/19/15 1134  TempSrc: Tympanic                 VAN STAVEREN,Blessed [place]irdner

## 2015-09-19 NOTE — Op Note (Signed)
[facility] Gastroenterology Patient Name: Katrina Martinez Procedure Date: 09/19/2015 11:15 AM MRN: [id number] Account #: [id number] Date of Birth: 02-18-1956 Admit Type: Outpatient Age: 60 Room: [facility] ENDO ROOM 4 Gender: Female Note Status: Finalized Procedure:            Upper GI endoscopy Indications:          Dysphagia Providers:            Manya Silvas, MD Referring MD:         Deborra Medina, MD (Referring MD) Medicines:            Propofol per Anesthesia Complications:        No immediate complications. Procedure:            Pre-Anesthesia Assessment:                       - After reviewing the risks and benefits, the patient                        was deemed in satisfactory condition to undergo the                        procedure.                       After obtaining informed consent, the endoscope was                        passed under direct vision. Throughout the procedure,                        the patient's blood pressure, pulse, and oxygen                        saturations were monitored continuously. The Endoscope                        was introduced through the mouth, and advanced to the                        second part of duodenum. The upper GI endoscopy was                        accomplished without difficulty. The patient tolerated                        the procedure well. Findings:      LA Grade A (one or more mucosal breaks less than 5 mm, not extending       between tops of 2 mucosal folds) esophagitis with no bleeding was found.       Biopsies were taken with a cold forceps for histology. A guidewire was       placed and the scope was withdrawn. Dilation was performed with a Savary       dilator with the dilator could not be passed at 15 mm and 16 mm.      Diffuse and patchy moderate inflammation characterized by erosions,       erythema, granularity and shallow ulcerations was found in the gastric       body and in the gastric  antrum. 2MM ulcer in antrum near the  pylorus.       Biopsies were taken with a cold forceps for histology. Biopsies were       taken with a cold forceps for Helicobacter pylori testing.      The examined duodenum was normal. Impression:           - LA Grade A esophagitis. Biopsied. Dilated.                       - Gastritis. Biopsied.                       - Normal examined duodenum. Recommendation:       - Await pathology results.                       - The findings and recommendations were discussed with                        the patient's family. Take medicine for stomach and                        esophagus. Manya Silvas, MD 09/19/2015 11:36:04 AM This report has been signed electronically. Number of Addenda: 0 Note Initiated On: 09/19/2015 11:15 AM      [facility]

## 2015-09-20 ENCOUNTER — Encounter: Payer: Self-pay | Admitting: Unknown Physician Specialty

## 2015-09-20 NOTE — Progress Notes (Signed)
C/o sore throat all the way down. To call office after 8;30 today.

## 2015-09-21 LAB — SURGICAL PATHOLOGY

## 2015-12-27 ENCOUNTER — Other Ambulatory Visit: Payer: Self-pay | Admitting: Rheumatology

## 2015-12-27 DIAGNOSIS — M25561 Pain in right knee: Secondary | ICD-10-CM

## 2015-12-29 ENCOUNTER — Other Ambulatory Visit: Payer: Self-pay | Admitting: Internal Medicine

## 2015-12-30 ENCOUNTER — Ambulatory Visit
Admission: RE | Admit: 2015-12-30 | Discharge: 2015-12-30 | Disposition: A | Discharge status: Home / Self Care | Payer: Commercial Managed Care - HMO | Source: Ambulatory Visit | Attending: Rheumatology | Admitting: Rheumatology

## 2015-12-30 DIAGNOSIS — M25461 Effusion, right knee: Secondary | ICD-10-CM | POA: Diagnosis not present

## 2015-12-30 DIAGNOSIS — M948X6 Other specified disorders of cartilage, lower leg: Secondary | ICD-10-CM | POA: Insufficient documentation

## 2015-12-30 DIAGNOSIS — S83231A Complex tear of medial meniscus, current injury, right knee, initial encounter: Secondary | ICD-10-CM | POA: Diagnosis not present

## 2015-12-30 DIAGNOSIS — M25561 Pain in right knee: Secondary | ICD-10-CM | POA: Diagnosis present

## 2016-01-02 ENCOUNTER — Other Ambulatory Visit: Payer: Self-pay

## 2016-01-22 ENCOUNTER — Other Ambulatory Visit: Payer: Self-pay | Admitting: Internal Medicine

## 2016-02-24 ENCOUNTER — Encounter: Payer: Self-pay | Admitting: *Deleted

## 2016-02-24 ENCOUNTER — Emergency Department
Admission: EM | Admit: 2016-02-24 | Discharge: 2016-02-24 | Disposition: A | Discharge status: Home / Self Care | Payer: Commercial Managed Care - HMO | Attending: Emergency Medicine | Admitting: Emergency Medicine

## 2016-02-24 ENCOUNTER — Emergency Department: Payer: Commercial Managed Care - HMO

## 2016-02-24 DIAGNOSIS — Y939 Activity, unspecified: Secondary | ICD-10-CM | POA: Insufficient documentation

## 2016-02-24 DIAGNOSIS — Z79899 Other long term (current) drug therapy: Secondary | ICD-10-CM | POA: Insufficient documentation

## 2016-02-24 DIAGNOSIS — W01198A Fall on same level from slipping, tripping and stumbling with subsequent striking against other object, initial encounter: Secondary | ICD-10-CM | POA: Insufficient documentation

## 2016-02-24 DIAGNOSIS — Y999 Unspecified external cause status: Secondary | ICD-10-CM | POA: Insufficient documentation

## 2016-02-24 DIAGNOSIS — F17210 Nicotine dependence, cigarettes, uncomplicated: Secondary | ICD-10-CM | POA: Insufficient documentation

## 2016-02-24 DIAGNOSIS — R0781 Pleurodynia: Secondary | ICD-10-CM | POA: Diagnosis not present

## 2016-02-24 DIAGNOSIS — Y929 Unspecified place or not applicable: Secondary | ICD-10-CM | POA: Diagnosis not present

## 2016-02-24 DIAGNOSIS — J209 Acute bronchitis, unspecified: Secondary | ICD-10-CM | POA: Diagnosis not present

## 2016-02-24 DIAGNOSIS — Z91040 Latex allergy status: Secondary | ICD-10-CM | POA: Diagnosis not present

## 2016-02-24 DIAGNOSIS — R0782 Intercostal pain: Secondary | ICD-10-CM | POA: Diagnosis not present

## 2016-02-24 DIAGNOSIS — S299XXA Unspecified injury of thorax, initial encounter: Secondary | ICD-10-CM | POA: Diagnosis not present

## 2016-02-24 MED ORDER — HYDROCOD POLST-CPM POLST ER 10-8 MG/5ML PO SUER: 5.0000 mL | Freq: Two times a day (BID) | ORAL | 0 refills | Status: DC | PRN

## 2016-02-24 MED ORDER — AZITHROMYCIN 250 MG PO TABS: ORAL_TABLET | ORAL | 0 refills | Status: AC

## 2016-02-24 NOTE — ED Notes (Signed)
See triage note states she slipped and fell last sat  Hit her left ribs on hard plastic booster seat  Having increased pain with movement and inspiration

## 2016-02-24 NOTE — ED Provider Notes (Signed)
[facility] Emergency Department Provider Note  ____________________________________________  Time seen: Approximately 6:05 PM  I have reviewed the triage vital signs and the nursing notes.   HISTORY  Chief Complaint Fall    HPI Katrina Martinez is a 61 y.o. female presenting to the emergency department after falling last Saturday. Patient states that she tripped over her grandson's car seat and fell against the car. Patient states that she had left lateral 9 through 10 rib pain following the incident. Patient states that pain was so bad it "took her breath".  Patient states that skin overlying ribs 9-10 has been tender to palpation since that time. Patient is presenting because she has also had productive cough with yellow sputum production and fatigue. Patient states that she has been coughing for close to one month. She only experiences dyspnea with coughing. With rib pain and cough, patient states that it has been "hard to take the pain". Patient states that she has felt warm but has not evaluated her temperature. She denies chest tightness, shortness of breath, recent travel, history of PE, history of DVT and prolonged immobilization. She states that she has tried Excedrin and heat application, which has not relieved her symptoms.   Past Medical History:  Diagnosis Date  . Anxiety   . Arthritis   . Arthritis   . Dermatomyositis ([place])   . Fibrocystic breast disease in female    annual mammograms Feb 2012 , [place]  . Fibromyalgia    did not tolerate Cymbalta trial (per records)  . Headache(784.0)   . Heart murmur    echo do 8 hr ago-arh  . Hyperlipidemia   . Sjogren's syndrome ([place])   . Tobacco abuse     Patient Active Problem List   Diagnosis Date Noted  . Chronic pain 05/03/2015  . Aortic atherosclerosis ([place]) 05/03/2015  . Dermatomyositis ([place]) 03/02/2015  . Tubular adenoma of colon 11/22/2014  . Skin lesion of left leg 06/29/2014  .  Polyarthritis of multiple sites ([place]) 06/28/2014  . Allergic rhinitis 04/07/2012  . Chronic bronchitis, mucopurulent ([place]) 02/05/2012  . Routine general medical examination at a health care facility 07/05/2011  . History of tobacco abuse 01/06/2011  . Tobacco abuse counseling 01/06/2011  . Fibromyalgia syndrome 01/06/2011  . Headache, drug induced 01/06/2011  . Fibrocystic breast disease in female   . Anxiety   . Elevated blood pressure reading without diagnosis of hypertension 01/03/2011    Past Surgical History:  Procedure Laterality Date  . ABDOMINAL HYSTERECTOMY    . APPENDECTOMY    . COLONOSCOPY WITH PROPOFOL N/A 10/28/2014   Procedure: COLONOSCOPY WITH PROPOFOL;  Surgeon: Manya Silvas, MD;  Location[facility] ENDOSCOPY;  Service: Endoscopy;  Laterality: N/A;  . ESOPHAGOGASTRODUODENOSCOPY (EGD) WITH PROPOFOL N/A 09/19/2015   Procedure: ESOPHAGOGASTRODUODENOSCOPY (EGD) WITH PROPOFOL;  Surgeon: Manya Silvas, MD;  Location: [facility] ENDOSCOPY;  Service: Endoscopy;  Laterality: N/A;  . [place] sling    . OOPHORECTOMY     bilateral  . SAVORY DILATION N/A 09/19/2015   Procedure: SAVORY DILATION;  Surgeon: Manya Silvas, MD;  Location: [facility] ENDOSCOPY;  Service: Endoscopy;  Laterality: N/A;  . TONSILLECTOMY    . TUBAL LIGATION      Prior to Admission medications   Medication Sig Start Date End Date Taking? Authorizing Provider  albuterol (PROVENTIL HFA) 108 (90 BASE) MCG/ACT inhaler     Historical Provider, MD  albuterol (PROVENTIL HFA;VENTOLIN HFA) 108 (90 BASE) MCG/ACT inhaler Inhale 2 puffs into the  lungs every 6 (six) hours as needed for wheezing or shortness of breath. 02/03/15   Crecencio Mc, MD  amitriptyline (ELAVIL) 10 MG tablet TAKE 1 TABLET (10 MG TOTAL) BY MOUTH AT BEDTIME. 05/30/15   Crecencio Mc, MD  amitriptyline (ELAVIL) 10 MG tablet TAKE 1 TABLET (10 MG TOTAL) BY MOUTH AT BEDTIME. 01/02/16   Crecencio Mc, MD  atorvastatin (LIPITOR) 20 MG tablet TAKE 1 TABLET (20  MG TOTAL) BY MOUTH DAILY. 01/23/16   Crecencio Mc, MD  azithromycin (ZITHROMAX Z-PAK) 250 MG tablet Take 2 tablets (500 mg) on  Day 1,  followed by 1 tablet (250 mg) once daily on Days 2 through 5. 02/24/16 02/29/16  Lannie Fields, PA-C  chlorpheniramine-HYDROcodone (TUSSIONEX PENNKINETIC ER) 10-8 MG/5ML SUER Take 5 mLs by mouth every 12 (twelve) hours as needed for cough. 02/24/16   Lannie Fields, PA-C  cyclobenzaprine (FLEXERIL) 5 MG tablet Take 5 mg by mouth 3 (three) times daily as needed for muscle spasms.    Historical Provider, MD  fluticasone (FLOVENT HFA) 44 MCG/ACT inhaler Reported on 05/02/2015 01/31/15   Historical Provider, MD  folic acid (FOLVITE) 1 MG tablet Take 1 mg by mouth daily.    Historical Provider, MD  hydroxychloroquine (PLAQUENIL) 200 MG tablet Take by mouth. 09/24/14   Historical Provider, MD  hydroxychloroquine (PLAQUENIL) 200 MG tablet Take by mouth daily.    Historical Provider, MD  methotrexate (RHEUMATREX) 2.5 MG tablet Take 24 mg by mouth once a week.    Historical Provider, MD  pregabalin (LYRICA) 75 MG capsule Take 1 capsule (75 mg total) by mouth 2 (two) times daily. 05/02/15   Crecencio Mc, MD  triamcinolone cream (KENALOG) 0.1 % Reported on 05/02/2015 09/01/14   Historical Provider, MD    Allergies Benzonatate; Hydrocortisone; Latex; Neosporin [neomycin-bacitracin zn-polymyx]; Polysporin [bacitracin-polymyxin b]; and Hydrogen peroxide  Family History  Problem Relation Age of Onset  . Diabetes Mother   . Hypertension Mother   . Heart disease Father   . Hyperlipidemia Father   . Diabetes Brother     Social History Social History  Substance Use Topics  . Smoking status: Current Some Day Smoker    Packs/day: 0.25    Years: 32.00    Types: Cigarettes  . Smokeless tobacco: Never Used     Comment: pt has cut back on smoking . she states she smokes one cigarette a day two at the most-last reviewed 03/03/12  . Alcohol use No     Review of Systems   Constitutional: No fever/chills Eyes: No visual changes. No discharge Cardiovascular: no chest pain. Respiratory: Patient has productive cough.  Gastrointestinal: No abdominal pain.  No nausea, no vomiting.  No diarrhea.  No constipation. Genitourinary: Negative for dysuria. No hematuria Musculoskeletal: Patient has left rib pain.  Skin: Negative for rash, abrasions, lacerations, ecchymosis.  Neurological: Negative for headaches, focal weakness or numbness. 10-point ROS otherwise negative.  ____________________________________________   PHYSICAL EXAM:  VITAL SIGNS: ED Triage Vitals [02/24/16 1647]  Enc Vitals Group     BP (!) 168/92     Pulse Rate 94     Resp 18     Temp 98.3 F (36.8 C)     Temp Source Oral     SpO2 99 %     Weight 156 lb (70.8 kg)     Height 5\' 6"  (1.676 m)     Head Circumference      Peak Flow  Pain Score 10     Pain Loc      Pain Edu?      Excl. in [place]?      Constitutional: Alert and oriented.  Eyes: Conjunctivae are normal. PERRL. EOMI. Head: Atraumatic. ENT:      Ears: Tympanic membranes are pearly bilaterally. Bony landmarks visualized bilaterally.      Nose: No congestion/rhinnorhea.      Mouth/Throat: Mucous membranes are moist.  Neck: No stridor.  Patient demonstrates full range of motion. No tenderness elicited with palpation of the C-spine. Cardiovascular: Normal rate, regular rhythm. Normal S1 and S2.  Good peripheral circulation. Respiratory: Normal respiratory effort without tachypnea or retractions. Patient is coughing throughout exam. Lungs CTAB. Good air entry to the bases with no decreased or absent breath sounds. Musculoskeletal: Patient has tenderness to palpation at the left chest. Tenderness is localized to skin overlying the lateral ribs 9 through 11. Neurologic:  Normal speech and language. No gross focal neurologic deficits are appreciated.  Skin:  Skin is warm, dry and intact. No rash noted. Psychiatric: Mood and  affect are normal. Speech and behavior are normal. Patient exhibits appropriate insight and judgement. ____________________________________________   LABS (all labs ordered are listed, but only abnormal results are displayed)  Labs Reviewed - No data to display ____________________________________________  EKG   ____________________________________________  RADIOLOGY Unk Pinto, personally viewed and evaluated these images (plain radiographs) as part of my medical decision making, as well as reviewing the written report by the radiologist.  Dg Chest 2 View  Result Date: 02/24/2016 CLINICAL DATA:  Fall 1 week ago.  Left rib pain EXAM: CHEST  2 VIEW COMPARISON:  Sixteen 2014 FINDINGS: The heart size and mediastinal contours are within normal limits. Both lungs are clear. The visualized skeletal structures are unremarkable. IMPRESSION: No active cardiopulmonary disease. Electronically Signed   By: Franchot Gallo M.D.   On: 02/24/2016 18:37    ____________________________________________    PROCEDURES  Procedure(s) performed:    Procedures    Medications - No data to display   ____________________________________________   INITIAL IMPRESSION / ASSESSMENT AND PLAN / ED COURSE  Pertinent labs & imaging results that were available during my care of the patient were reviewed by me and considered in my medical decision making (see chart for details).  Review of the [place] CSRS was performed in accordance of the [place] prior to dispensing any controlled drugs.  Clinical Course     Assessment and Plan:  Acute Bronchitis  Patient presents to the emergency department with right rib pain, 9-10. DG test conducted in the emergency department does not reveal acute bony fractures or abnormalities. No consolidations or findings consistent with pneumonia were visualized. Patient has purulent sputum formation, fatigue and cough. Symptoms are consistent with acute bronchitis. Patient  was discharged with azithromycin. All patient questions were answered. Vital signs are reassuring aside from hypertension.     ____________________________________________  FINAL CLINICAL IMPRESSION(S) / ED DIAGNOSES  Final diagnoses:  Acute bronchitis, unspecified organism  Rib pain      NEW MEDICATIONS STARTED DURING THIS VISIT:  New Prescriptions   AZITHROMYCIN (ZITHROMAX Z-PAK) 250 MG TABLET    Take 2 tablets (500 mg) on  Day 1,  followed by 1 tablet (250 mg) once daily on Days 2 through 5.   CHLORPHENIRAMINE-HYDROCODONE (TUSSIONEX PENNKINETIC ER) 10-8 MG/5ML SUER    Take 5 mLs by mouth every 12 (twelve) hours as needed for cough.  This chart was dictated using voice recognition software/Dragon. Despite best efforts to proofread, errors can occur which can change the meaning. Any change was purely unintentional.    Lannie Fields, PA-C 02/24/16 [place], MD 02/24/16 [id number]

## 2016-02-24 NOTE — ED Triage Notes (Signed)
States she tripped and fell last Saturday landing on her left rib cage and states continued pain and especially with deep breathes

## 2016-02-27 ENCOUNTER — Encounter: Payer: Self-pay | Admitting: Internal Medicine

## 2016-02-27 ENCOUNTER — Ambulatory Visit (INDEPENDENT_AMBULATORY_CARE_PROVIDER_SITE_OTHER): Payer: Commercial Managed Care - HMO | Admitting: Internal Medicine

## 2016-02-27 ENCOUNTER — Ambulatory Visit (INDEPENDENT_AMBULATORY_CARE_PROVIDER_SITE_OTHER): Payer: Commercial Managed Care - HMO

## 2016-02-27 VITALS — BP 110/72 | HR 66 | Temp 98.1°F | Resp 16 | Ht 64.0 in | Wt 161.1 lb

## 2016-02-27 DIAGNOSIS — R0789 Other chest pain: Secondary | ICD-10-CM | POA: Diagnosis not present

## 2016-02-27 DIAGNOSIS — J441 Chronic obstructive pulmonary disease with (acute) exacerbation: Secondary | ICD-10-CM

## 2016-02-27 DIAGNOSIS — M3390 Dermatopolymyositis, unspecified, organ involvement unspecified: Secondary | ICD-10-CM

## 2016-02-27 DIAGNOSIS — E663 Overweight: Secondary | ICD-10-CM

## 2016-02-27 DIAGNOSIS — M3313 Other dermatomyositis without myopathy: Secondary | ICD-10-CM

## 2016-02-27 DIAGNOSIS — S2232XA Fracture of one rib, left side, initial encounter for closed fracture: Secondary | ICD-10-CM

## 2016-02-27 DIAGNOSIS — E559 Vitamin D deficiency, unspecified: Secondary | ICD-10-CM

## 2016-02-27 DIAGNOSIS — Z87891 Personal history of nicotine dependence: Secondary | ICD-10-CM

## 2016-02-27 DIAGNOSIS — R040 Epistaxis: Secondary | ICD-10-CM

## 2016-02-27 MED ORDER — FLUTICASONE PROPIONATE HFA 44 MCG/ACT IN AERO: 2.0000 | INHALATION_SPRAY | Freq: Two times a day (BID) | RESPIRATORY_TRACT | 0 refills | Status: DC

## 2016-02-27 MED ORDER — MUPIROCIN 2 % EX OINT: 1.0000 "application " | TOPICAL_OINTMENT | Freq: Two times a day (BID) | CUTANEOUS | 0 refills | Status: AC

## 2016-02-27 MED ORDER — HYDROCOD POLST-CPM POLST ER 10-8 MG/5ML PO SUER: 5.0000 mL | Freq: Two times a day (BID) | ORAL | 0 refills | Status: AC | PRN

## 2016-02-27 NOTE — Progress Notes (Signed)
Subjective:  Patient ID: Katrina Martinez, female    DOB: 01-31-1956  Age: 61 y.o. MRN: [id number]  CC: The primary encounter diagnosis was Anterior chest wall pain. Diagnoses of Vitamin D deficiency, Closed fracture of one rib of left side, initial encounter, Dermatomyositis ([place]), History of tobacco abuse, Epistaxis, Obstructive chronic bronchitis with exacerbation ([place]), and Overweight were also pertinent to this visit.  HPI Katrina Martinez presents for follow up on multiple issues.  Treated in ER on Jan 5 for for left rib pain and sinusitis . Rib pain occurred after a fall against a baby car seat.  Rib films were not done,  Only a PA an dlateral,  so rib fracture was not seen.  Since she was suffering from acute  Sinusitis at the [facility] , she was prescribed a 5 day course azithromycin and  Small bottle tussionex on jan 5th to use every 12 hours for control of cough which was aggravating  Her rib pain .  The cough is still productive. But she denies obective fevers.  She does not tolerate prednisone tapers.  Still smoking but has reduced her daily use by a variable amount,    2) Has developed sores in nose,  Had a nosebleed yesterday that lasted about  20 minutes,  Used toilet tissue to stop it. Has not bled today except when she blows it.  Marland Kitchen   3) FM and dermatomyositis: addressed at visit with ES  Jan 2017. Referred to [facility].  Now Taking MTX every Friday but skipped last week's dose. Also taking placquenil.   5) Weight gain: has developed central adiposity , weight gain  aggravated by knee pain/effusion due to DJD knees making it difficult to exercise, told by Ortho that she needed right knee replacement.  History of torn cartilage repair done on left knee in 2008   Not taking vitamin d.  No recent check .   Outpatient Medications Prior to Visit  Medication Sig Dispense Refill  . albuterol (PROVENTIL HFA;VENTOLIN HFA) 108 (90 BASE) MCG/ACT inhaler Inhale 2 puffs into the lungs every 6 (six)  hours as needed for wheezing or shortness of breath. 3.7 g 11  . amitriptyline (ELAVIL) 10 MG tablet TAKE 1 TABLET (10 MG TOTAL) BY MOUTH AT BEDTIME. 30 tablet 3  . atorvastatin (LIPITOR) 20 MG tablet TAKE 1 TABLET (20 MG TOTAL) BY MOUTH DAILY. 90 tablet 1  . azithromycin (ZITHROMAX Z-PAK) 250 MG tablet Take 2 tablets (500 mg) on  Day 1,  followed by 1 tablet (250 mg) once daily on Days 2 through 5. 6 each 0  . folic acid (FOLVITE) 1 MG tablet Take 1 mg by mouth daily.    . hydroxychloroquine (PLAQUENIL) 200 MG tablet Take by mouth.    . hydroxychloroquine (PLAQUENIL) 200 MG tablet Take by mouth daily.    . methotrexate (RHEUMATREX) 2.5 MG tablet Take 24 mg by mouth once a week.    . triamcinolone cream (KENALOG) 0.1 % Reported on 05/02/2015  1  . chlorpheniramine-HYDROcodone (TUSSIONEX PENNKINETIC ER) 10-8 MG/5ML SUER Take 5 mLs by mouth every 12 (twelve) hours as needed for cough. 115 mL 0  . fluticasone (FLOVENT HFA) 44 MCG/ACT inhaler Reported on 05/02/2015    . cyclobenzaprine (FLEXERIL) 5 MG tablet Take 5 mg by mouth 3 (three) times daily as needed for muscle spasms.    Marland Kitchen albuterol (PROVENTIL HFA) 108 (90 BASE) MCG/ACT inhaler     . amitriptyline (ELAVIL) 10 MG tablet TAKE 1 TABLET (  10 MG TOTAL) BY MOUTH AT BEDTIME. 30 tablet 1  . pregabalin (LYRICA) 75 MG capsule Take 1 capsule (75 mg total) by mouth 2 (two) times daily. 60 capsule 2   No facility-administered medications prior to visit.     Review of Systems;  Patient denies headache, fevers, malaise, unintentional weight loss, skin rash, eye pain, sinus congestion and sinus pain, sore throat, dysphagia,  hemoptysis , cough, dyspnea, wheezing, chest pain, palpitations, orthopnea, edema, abdominal pain, nausea, melena, diarrhea, constipation, flank pain, dysuria, hematuria, urinary  Frequency, nocturia, numbness, tingling, seizures,  Focal weakness, Loss of consciousness,  Tremor, insomnia, depression, anxiety, and suicidal ideation.       Objective:  BP 110/72   Pulse 66   Temp 98.1 F (36.7 C) (Oral)   Resp 16   Ht 5\' 4"  (1.626 m)   Wt 161 lb 2 oz (73.1 kg)   SpO2 96%   BMI 27.66 kg/m   BP Readings from Last 3 Encounters:  02/27/16 110/72  02/24/16 (!) 166/82  09/19/15 (!) 187/81    Wt Readings from Last 3 Encounters:  02/27/16 161 lb 2 oz (73.1 kg)  02/24/16 156 lb (70.8 kg)  09/19/15 150 lb (68 kg)    General appearance: alert, cooperative and appears stated age Ears: normal TM's and external ear canals both ears Throat: lips, mucosa, and tongue normal; teeth and gums normal Neck: no adenopathy, no carotid bruit, supple, symmetrical, trachea midline and thyroid not enlarged, symmetric, no tenderness/mass/nodules Back: symmetric, no curvature. ROM normal. No CVA tenderness. Lungs: clear to auscultation bilaterally Heart: regular rate and rhythm, S1, S2 normal, no murmur, click, rub or gallop Abdomen: soft,tender under left ribcage anteriorly , no bruising or massesr; bowel sounds normal; no masses,  no organomegaly Pulses: 2+ and symmetric Skin: Skin color, texture, turgor normal. No rashes or lesions Lymph nodes: Cervical, supraclavicular, and axillary nodes normal.  No results found for: HGBA1C  Lab Results  Component Value Date   CREATININE 0.87 05/06/2015   CREATININE 0.85 06/28/2014    Lab Results  Component Value Date   WBC 9.6 06/28/2014   HGB 15.4 (H) 06/28/2014   HCT 44.8 06/28/2014   PLT 255.0 06/28/2014   GLUCOSE 95 05/06/2015   CHOL 249 (H) 05/06/2015   TRIG 94.0 05/06/2015   HDL 50.90 05/06/2015   LDLCALC 179 (H) 05/06/2015   ALT 20 05/06/2015   AST 19 05/06/2015   NA 141 05/06/2015   K 4.0 05/06/2015   CL 106 05/06/2015   CREATININE 0.87 05/06/2015   BUN 11 05/06/2015   CO2 25 05/06/2015   TSH 1.26 05/06/2015    Dg Chest 2 View  Result Date: 02/24/2016 CLINICAL DATA:  Fall 1 week ago.  Left rib pain EXAM: CHEST  2 VIEW COMPARISON:  Sixteen 2014 FINDINGS: The  heart size and mediastinal contours are within normal limits. Both lungs are clear. The visualized skeletal structures are unremarkable. IMPRESSION: No active cardiopulmonary disease. Electronically Signed   By: Franchot Gallo M.D.   On: 02/24/2016 18:37    Assessment & Plan:   Problem List Items Addressed This Visit    Closed rib fracture    Slightly displaced fracture of 8th rib seen on unilateral films done today in nffice for persistent pain since fall on Jan 3.  She was prescribed tussionex on Jan 5th,  112 ml , for use bid,  Refill given for filling on Jan 16th . Continue tylenol, ice, and restrict activity to non strenuous.  NSAIDs discouraged dur to history of gastric ulcer July 2017 EGD      Dermatomyositis [facility])    Managed by Rheumatology with Placquenil and MTX       Epistaxis    Given the presence of sores,  Will treat for MRSA with mupirocin and saline lavage      History of tobacco abuse    She is slowly reducing her use but still smoking,  encouragement given,       Obstructive chronic bronchitis with exacerbation ([place])    Continue azithromycin,  Adding flovent ,  Refilling tussionex.       Relevant Medications   fluticasone (FLOVENT HFA) 44 MCG/ACT inhaler   chlorpheniramine-HYDROcodone (TUSSIONEX PENNKINETIC ER) 10-8 MG/5ML SUER   Overweight    Secondary to inactivity due to joint pain,  Strongly recommended using an indoor pool to exercise      Vitamin D deficiency    Not currently taking a supplement, level is pending        Other Visit Diagnoses    Anterior chest wall pain    -  Primary   Relevant Orders   DG Ribs Unilateral Left (Completed)      I have discontinued Ms. Warrick's pregabalin. I have also changed her fluticasone and chlorpheniramine-HYDROcodone. Additionally, I am having her start on mupirocin ointment. Lastly, I am having her maintain her triamcinolone cream, hydroxychloroquine, albuterol, cyclobenzaprine, folic acid, hydroxychloroquine,  methotrexate, amitriptyline, atorvastatin, and azithromycin.  Meds ordered this encounter  Medications  . mupirocin ointment (BACTROBAN) 2 %    Sig: Place 1 application into the nose 2 (two) times daily.    Dispense:  22 g    Refill:  0  . fluticasone (FLOVENT HFA) 44 MCG/ACT inhaler    Sig: Inhale 2 puffs into the lungs 2 (two) times daily. Reported on 05/02/2015    Dispense:  1 Inhaler    Refill:  0  . chlorpheniramine-HYDROcodone (TUSSIONEX PENNKINETIC ER) 10-8 MG/5ML SUER    Sig: Take 5 mLs by mouth every 12 (twelve) hours as needed for cough. May refill on or after Januayr 16 2018    Dispense:  115 mL    Refill:  0    Medications Discontinued During This Encounter  Medication Reason  . amitriptyline (ELAVIL) 10 MG tablet Duplicate  . pregabalin (LYRICA) 75 MG capsule Discontinued by provider  . albuterol (PROVENTIL HFA) 108 (90 BASE) MCG/ACT inhaler Duplicate  . fluticasone (FLOVENT HFA) 44 MCG/ACT inhaler Reorder  . chlorpheniramine-HYDROcodone (TUSSIONEX PENNKINETIC ER) 10-8 MG/5ML SUER Reorder   A total of 40 minutes was spent with patient more than half of which was spent in counseling patient on the above mentioned issues , reviewing and explaining recent labs and imaging studies done, and coordination of care.   Follow-up: No Follow-up on file.   Crecencio Mc, MD

## 2016-02-27 NOTE — Patient Instructions (Signed)
I am ordering rib films today  I will refill your tussionex when you run out.   Please radd back the  flovent inhaler for your bronchitis   I have prescribed mupirocin ointment to use in your nose twice daily for the next 10 days  I recommend starting a water program for exercise when you are feeling better

## 2016-02-28 ENCOUNTER — Telehealth: Payer: Self-pay

## 2016-02-28 DIAGNOSIS — J441 Chronic obstructive pulmonary disease with (acute) exacerbation: Secondary | ICD-10-CM | POA: Insufficient documentation

## 2016-02-28 DIAGNOSIS — S2239XA Fracture of one rib, unspecified side, initial encounter for closed fracture: Secondary | ICD-10-CM | POA: Insufficient documentation

## 2016-02-28 DIAGNOSIS — E663 Overweight: Secondary | ICD-10-CM | POA: Insufficient documentation

## 2016-02-28 DIAGNOSIS — R040 Epistaxis: Secondary | ICD-10-CM | POA: Insufficient documentation

## 2016-02-28 NOTE — Assessment & Plan Note (Signed)
Given the presence of sores,  Will treat for MRSA with mupirocin and saline lavage

## 2016-02-28 NOTE — Assessment & Plan Note (Signed)
Secondary to inactivity due to joint pain,  Strongly recommended using an indoor pool to exercise

## 2016-02-28 NOTE — Telephone Encounter (Signed)
Patient would like a cough medicine for during the day since she has a cough medicine for the evening and some from the hospital. Also would like some pain medicine during the day other than tylenol for her rib pain. Please advise

## 2016-02-28 NOTE — Assessment & Plan Note (Signed)
Not currently taking a supplement, level is pending

## 2016-02-28 NOTE — Assessment & Plan Note (Signed)
Managed by Rheumatology with Placquenil and MTX

## 2016-02-28 NOTE — Assessment & Plan Note (Addendum)
Slightly displaced fracture of 8th rib seen on unilateral films done today in nffice for persistent pain since fall on Jan 3.  She was prescribed tussionex on Jan 5th,  112 ml , for use bid,  Refill given for filling on Jan 16th . Continue tylenol, ice, and restrict activity to non strenuous. NSAIDs discouraged dur to history of gastric ulcer July 2017 EGD

## 2016-02-28 NOTE — Telephone Encounter (Signed)
She was prescribed enough tussionex by the ER for use every 12 hours when she saw them,  And the rx I gave her yesterday for tussionex refill on jn 16th will extend that, I cannot prescribe additional pain medication at this time

## 2016-02-28 NOTE — Assessment & Plan Note (Signed)
Continue azithromycin,  Adding flovent ,  Refilling tussionex.

## 2016-02-28 NOTE — Assessment & Plan Note (Addendum)
She is slowly reducing her use but still smoking,  encouragement given,

## 2016-02-29 NOTE — Telephone Encounter (Signed)
Patient advised of below and verbalized an understanding  

## 2016-03-13 ENCOUNTER — Other Ambulatory Visit
Admission: RE | Admit: 2016-03-13 | Discharge: 2016-03-13 | Disposition: A | Discharge status: Home / Self Care | Payer: Commercial Managed Care - HMO | Source: Ambulatory Visit | Attending: Rheumatology | Admitting: Rheumatology

## 2016-03-13 DIAGNOSIS — M3390 Dermatopolymyositis, unspecified, organ involvement unspecified: Secondary | ICD-10-CM | POA: Diagnosis not present

## 2016-03-13 DIAGNOSIS — Z79899 Other long term (current) drug therapy: Secondary | ICD-10-CM | POA: Diagnosis not present

## 2016-03-13 DIAGNOSIS — E559 Vitamin D deficiency, unspecified: Secondary | ICD-10-CM | POA: Diagnosis not present

## 2016-03-13 DIAGNOSIS — G8929 Other chronic pain: Secondary | ICD-10-CM | POA: Insufficient documentation

## 2016-03-13 DIAGNOSIS — M25562 Pain in left knee: Secondary | ICD-10-CM | POA: Insufficient documentation

## 2016-03-13 DIAGNOSIS — M25561 Pain in right knee: Secondary | ICD-10-CM | POA: Diagnosis not present

## 2016-03-13 LAB — CBC AND DIFFERENTIAL
HEMATOCRIT: 42 % (ref 36–46)
HEMOGLOBIN: 14.2 g/dL (ref 12.0–16.0)
Platelets: 289 10*3/uL (ref 150–399)
WBC: 7.4 10^3/mL

## 2016-03-13 LAB — SYNOVIAL CELL COUNT + DIFF, W/ CRYSTALS
Crystals, Fluid: NONE SEEN
Eosinophils-Synovial: 0 %
Lymphocytes-Synovial Fld: 11 %
Monocyte-Macrophage-Synovial Fluid: 82 %
Neutrophil, Synovial: 7 %
Other Cells-SYN: 0
WBC, SYNOVIAL: 1945 /mm3 — AB (ref 0–200)

## 2016-03-13 LAB — HEMOGLOBIN A1C: Hemoglobin A1C: 5.7

## 2016-03-13 LAB — LIPID PANEL
CHOLESTEROL: 253 mg/dL — AB (ref 0–200)
HDL: 49 mg/dL (ref 35–70)
LDL CALC: 183 mg/dL
Triglycerides: 103 mg/dL (ref 40–160)

## 2016-03-13 LAB — VITAMIN D 25 HYDROXY (VIT D DEFICIENCY, FRACTURES): VIT D 25 HYDROXY: 15.9

## 2016-03-13 LAB — BASIC METABOLIC PANEL: Creatinine: 0.8 mg/dL (ref <=1.1)

## 2016-03-13 LAB — HEPATIC FUNCTION PANEL
ALT: 82 U/L — AB (ref 7–35)
AST: 62 U/L — AB (ref 13–35)

## 2016-03-29 ENCOUNTER — Other Ambulatory Visit: Payer: Self-pay | Admitting: Internal Medicine

## 2016-04-02 ENCOUNTER — Encounter: Payer: Self-pay | Admitting: Internal Medicine

## 2016-04-02 ENCOUNTER — Ambulatory Visit (INDEPENDENT_AMBULATORY_CARE_PROVIDER_SITE_OTHER): Payer: Commercial Managed Care - HMO | Admitting: Internal Medicine

## 2016-04-02 ENCOUNTER — Ambulatory Visit (INDEPENDENT_AMBULATORY_CARE_PROVIDER_SITE_OTHER): Payer: Commercial Managed Care - HMO

## 2016-04-02 VITALS — BP 130/80 | HR 84 | Temp 98.6°F | Resp 16 | Wt 156.0 lb

## 2016-04-02 DIAGNOSIS — S2232XD Fracture of one rib, left side, subsequent encounter for fracture with routine healing: Secondary | ICD-10-CM | POA: Diagnosis not present

## 2016-04-02 DIAGNOSIS — R030 Elevated blood-pressure reading, without diagnosis of hypertension: Secondary | ICD-10-CM

## 2016-04-02 DIAGNOSIS — M542 Cervicalgia: Secondary | ICD-10-CM

## 2016-04-02 DIAGNOSIS — M064 Inflammatory polyarthropathy: Secondary | ICD-10-CM

## 2016-04-02 DIAGNOSIS — E559 Vitamin D deficiency, unspecified: Secondary | ICD-10-CM

## 2016-04-02 DIAGNOSIS — J441 Chronic obstructive pulmonary disease with (acute) exacerbation: Secondary | ICD-10-CM

## 2016-04-02 DIAGNOSIS — M130 Polyarthritis, unspecified: Secondary | ICD-10-CM

## 2016-04-02 DIAGNOSIS — I700 Atherosclerosis of aorta: Secondary | ICD-10-CM

## 2016-04-02 MED ORDER — ERGOCALCIFEROL 1.25 MG (50000 UT) PO CAPS: 50000.0000 [IU] | ORAL_CAPSULE | ORAL | 2 refills | Status: DC

## 2016-04-02 MED ORDER — HYDROCOD POLST-CPM POLST ER 10-8 MG/5ML PO SUER: 5.0000 mL | Freq: Two times a day (BID) | ORAL | 0 refills | Status: AC | PRN

## 2016-04-02 MED ORDER — METHYLPREDNISOLONE ACETATE 80 MG/ML IJ SUSP
80.0000 mg | Freq: Once | INTRAMUSCULAR | Status: AC
  Administered 2016-04-02: 80 mg via INTRAMUSCULAR

## 2016-04-02 NOTE — Progress Notes (Signed)
Subjective:  Patient ID: Katrina Martinez, female    DOB: 1955-11-06  Age: 61 y.o. MRN: [id number]  CC: The primary encounter diagnosis was Closed fracture of one rib of left side with routine healing, subsequent encounter. Diagnoses of Bilateral posterior neck pain, Obstructive chronic bronchitis with exacerbation ([place]), Elevated blood pressure reading without diagnosis of hypertension, Aortic atherosclerosis ([place]), Vitamin D deficiency, and Polyarthritis of multiple sites [facility]) were also pertinent to this visit.  HPI DAYSIE HADDER presents for follow up on multiple chronic issues:  1) she continues to have chronic pain involving multiple joints attributed to fibromyalgia and dermatomyositis     Patient tearful today worried that she has been given the wrong diagnosis.    New complaint :neck stiffness  Ear pain for the past week.  both legs and knees developed edema and she underwent  left knee  asspiration by Dr. Jefm Bryant.  History  of meniscal tears bilaterally with no orthopedics follow up which was advised by Dr Jefm Bryant.   2) Bronchitis: she states that her Cough never improved, despite treatment with azithromycin,  Steroid MDI (flovent) and  tussionex.  She describes  Aching in her chest, and now her ears and  throat hurt now.    Taking delsym and using her inhaler , spent the night sitting up in a recliner,. Coughing.   drinking coffee to lubricate her throat. .  No fevers.   Says she Can't take prednisone because it makes her irritable,  but willing to try now .  Marland Kitchen  Last cigarette was supposedly a few weeks ago.  . Ran out of tussionex last week.    3) Nose scabs have resolved all but on on the left side,  Still using mupiricin   4) History of esophageal stricture, was dilated in 2016,  Taking omeprazole 20 mg daily since then   5)  2nd opinion rheumatology  Was requested and referral made by Dr Caryl Bis    6) Aortic atherosclerosis also noted on chest x ray. .  Cholesterol high  despite Taking atorvastatin.  Says she forgets it several times per week     Outpatient Medications Prior to Visit  Medication Sig Dispense Refill  . albuterol (PROVENTIL HFA;VENTOLIN HFA) 108 (90 BASE) MCG/ACT inhaler Inhale 2 puffs into the lungs every 6 (six) hours as needed for wheezing or shortness of breath. 3.7 g 11  . amitriptyline (ELAVIL) 10 MG tablet TAKE 1 TABLET (10 MG TOTAL) BY MOUTH AT BEDTIME. 30 tablet 3  . atorvastatin (LIPITOR) 20 MG tablet TAKE 1 TABLET (20 MG TOTAL) BY MOUTH DAILY. 90 tablet 1  . chlorpheniramine-HYDROcodone (TUSSIONEX PENNKINETIC ER) 10-8 MG/5ML SUER Take 5 mLs by mouth every 12 (twelve) hours as needed for cough. May refill on or after Januayr 16 2018 115 mL 0  . cyclobenzaprine (FLEXERIL) 5 MG tablet Take 5 mg by mouth 3 (three) times daily as needed for muscle spasms.    Marland Kitchen FLOVENT HFA 44 MCG/ACT inhaler TAKE 2 PUFFS BY MOUTH TWICE A DAY [id number] Inhaler 0  . folic acid (FOLVITE) 1 MG tablet Take 1 mg by mouth daily.    . hydroxychloroquine (PLAQUENIL) 200 MG tablet Take by mouth daily.    . methotrexate (RHEUMATREX) 2.5 MG tablet Take 24 mg by mouth once a week.    . mupirocin ointment (BACTROBAN) 2 % Place 1 application into the nose 2 (two) times daily. 22 g 0  . triamcinolone cream (KENALOG) 0.1 % Reported on 05/02/2015  1  . hydroxychloroquine (PLAQUENIL) 200 MG tablet Take by mouth.     No facility-administered medications prior to visit.     Review of Systems;  Patient denies headache, fevers, , unintentional weight loss, skin rash, eye pain,  hemoptysis ,  dyspnea, wheezing,  palpitations, orthopnea, edema, abdominal pain, nausea, melena, diarrhea, constipation, flank pain, dysuria, hematuria, urinary  Frequency, nocturia, numbness, tingling, seizures,  Focal weakness, Loss of consciousness,  Tremor, insomnia, depression, anxiety, and suicidal ideation.      Objective:  BP 130/80   Pulse 84   Temp 98.6 F (37 C) (Oral)   Resp 16   Wt 156 lb  (70.8 kg)   SpO2 96%   BMI 26.78 kg/m   BP Readings from Last 3 Encounters:  04/02/16 130/80  02/27/16 110/72  02/24/16 (!) 166/82    Wt Readings from Last 3 Encounters:  04/02/16 156 lb (70.8 kg)  02/27/16 161 lb 2 oz (73.1 kg)  02/24/16 156 lb (70.8 kg)    General appearance: alert, cooperative and appears stated age Ears: normal TM's and external ear canals both ears Throat: lips, mucosa, and tongue normal; teeth and gums normal Neck: no adenopathy, no carotid bruit, supple, symmetrical, trachea midline and thyroid not enlarged, symmetric, no tenderness/mass/nodules Back: symmetric, no curvature. ROM normal. No CVA tenderness. Lungs: clear to auscultation bilaterally Heart: regular rate and rhythm, S1, S2 normal, no murmur, click, rub or gallop Abdomen: soft, non-tender; bowel sounds normal; no masses,  no organomegaly Pulses: 2+ and symmetric Skin: Skin color, texture, turgor normal. No rashes or lesions Lymph nodes: Cervical, supraclavicular, and axillary nodes normal.  No results found for: HGBA1C  Lab Results  Component Value Date   CREATININE 0.87 05/06/2015   CREATININE 0.85 06/28/2014    Lab Results  Component Value Date   WBC 9.6 06/28/2014   HGB 15.4 (H) 06/28/2014   HCT 44.8 06/28/2014   PLT 255.0 06/28/2014   GLUCOSE 95 05/06/2015   CHOL 249 (H) 05/06/2015   TRIG 94.0 05/06/2015   HDL 50.90 05/06/2015   LDLCALC 179 (H) 05/06/2015   ALT 20 05/06/2015   AST 19 05/06/2015   NA 141 05/06/2015   K 4.0 05/06/2015   CL 106 05/06/2015   CREATININE 0.87 05/06/2015   BUN 11 05/06/2015   CO2 25 05/06/2015   TSH 1.26 05/06/2015    No results found.  Assessment & Plan:   Problem List Items Addressed This Visit    Aortic atherosclerosis ([place])    Noted on CT.  Recommended increased adherence to  statin therapy   Lab Results  Component Value Date   CHOL 249 (H) 05/06/2015   HDL 50.90 05/06/2015   LDLCALC 179 (H) 05/06/2015   TRIG 94.0 05/06/2015     CHOLHDL 5 05/06/2015  '      Closed rib fracture - Primary    Secondary to fall . Repeat films show that the fracture is healing       Relevant Orders   DG Ribs Unilateral Left (Completed)   Elevated blood pressure reading without diagnosis of hypertension    bp today is normal.  No medications indicated       Obstructive chronic bronchitis with exacerbation ([place])    Will treat with steroid IM and PO and cough suppressant as she has already had a round of antibiotics.  Advised to follow up with Dr Alva Garnet, her pulmonologist if her cough fails to resolve this time      Relevant  Medications   chlorpheniramine-HYDROcodone (TUSSIONEX PENNKINETIC ER) 10-8 MG/5ML SUER   methylPREDNISolone acetate (DEPO-MEDROL) injection 80 mg (Completed)   Polyarthritis of multiple sites [facility])    2nd opinion in process, from rheumatology, per patient request. Orthopedics referral advised for knee pain       Vitamin D deficiency    Recurrent,  Will treat with megadose       Other Visit Diagnoses    Bilateral posterior neck pain       Relevant Orders   DG Cervical Spine Complete (Completed)    A total of 25 minutes of face to face time was spent with patient more than half of which was spent in counselling about the above mentioned conditions  and coordination of care   I am having Ms. Thornhill start on ergocalciferol and chlorpheniramine-HYDROcodone. I am also having her maintain her triamcinolone cream, albuterol, cyclobenzaprine, folic acid, hydroxychloroquine, methotrexate, amitriptyline, atorvastatin, mupirocin ointment, chlorpheniramine-HYDROcodone, FLOVENT HFA, and omeprazole. We administered methylPREDNISolone acetate.  Meds ordered this encounter  Medications  . omeprazole (PRILOSEC) 20 MG capsule    Sig: Take 1 capsule by mouth daily.  . ergocalciferol (DRISDOL) 50000 units capsule    Sig: Take 1 capsule (50,000 Units total) by mouth once a week. With food    Dispense:  4 capsule     Refill:  2  . chlorpheniramine-HYDROcodone (TUSSIONEX PENNKINETIC ER) 10-8 MG/5ML SUER    Sig: Take 5 mLs by mouth every 12 (twelve) hours as needed for cough.    Dispense:  120 mL    Refill:  0  . methylPREDNISolone acetate (DEPO-MEDROL) injection 80 mg    Medications Discontinued During This Encounter  Medication Reason  . hydroxychloroquine (PLAQUENIL) [id number] MG tablet Duplicate    Follow-up: No Follow-up on file.   Crecencio Mc, MD

## 2016-04-02 NOTE — Patient Instructions (Addendum)
I am refilling the tussionex for 10 days,  And recommending that you take a tapering dose of prednisone .  You received a host of Depo Medrol today (steroids0  You do not need antibiotics at this tinme  Once you finish the tussionex,  Start taking 25 mg benadryl  At bedtime to dry up your sinuses.    If your cough persists,  please go see Dr Alva Garnet, your pulmonologist.  I am x raysin gyour  Chest wall and your neck today  .    Your vitamin D is low.   I am calling in a megadose of Vit D to take once weekly for a total of 3 months.  After you finish the weekly Vitamin D supplement, you should start taking an OTC  Vit D3 supplement 1000 units daily, November through April. Marland Kitchen   Please consider an Orthopedic referral to address your bilateral joint pain

## 2016-04-02 NOTE — Progress Notes (Signed)
Pre visit review using our clinic review tool, if applicable. No additional management support is needed unless otherwise documented below in the visit note. 

## 2016-04-04 NOTE — Assessment & Plan Note (Signed)
bp today is normal.  No medications indicated

## 2016-04-04 NOTE — Assessment & Plan Note (Signed)
Recurrent,  Will treat with megadose

## 2016-04-04 NOTE — Assessment & Plan Note (Signed)
Will treat with steroid IM and PO and cough suppressant as she has already had a round of antibiotics.  Advised to follow up with Dr Alva Garnet, her pulmonologist if her cough fails to resolve this time

## 2016-04-04 NOTE — Assessment & Plan Note (Signed)
2nd opinion in process, from rheumatology, per patient request. Orthopedics referral advised for knee pain

## 2016-04-04 NOTE — Assessment & Plan Note (Signed)
Noted on CT.  Recommended increased adherence to  statin therapy   Lab Results  Component Value Date   CHOL 249 (H) 05/06/2015   HDL 50.90 05/06/2015   LDLCALC 179 (H) 05/06/2015   TRIG 94.0 05/06/2015   CHOLHDL 5 05/06/2015  '

## 2016-04-04 NOTE — Assessment & Plan Note (Signed)
Secondary to fall . Repeat films show that the fracture is healing

## 2016-04-09 ENCOUNTER — Encounter: Payer: Self-pay | Admitting: Internal Medicine

## 2016-04-28 ENCOUNTER — Other Ambulatory Visit: Payer: Self-pay | Admitting: Internal Medicine

## 2016-06-14 DIAGNOSIS — M3390 Dermatopolymyositis, unspecified, organ involvement unspecified: Secondary | ICD-10-CM | POA: Diagnosis not present

## 2016-06-14 DIAGNOSIS — M25562 Pain in left knee: Secondary | ICD-10-CM | POA: Diagnosis not present

## 2016-06-14 DIAGNOSIS — Z79899 Other long term (current) drug therapy: Secondary | ICD-10-CM | POA: Diagnosis not present

## 2016-06-18 DIAGNOSIS — Z79899 Other long term (current) drug therapy: Secondary | ICD-10-CM | POA: Diagnosis not present

## 2016-06-18 DIAGNOSIS — M3500 Sicca syndrome, unspecified: Secondary | ICD-10-CM | POA: Diagnosis not present

## 2016-06-18 DIAGNOSIS — M3390 Dermatopolymyositis, unspecified, organ involvement unspecified: Secondary | ICD-10-CM | POA: Diagnosis not present

## 2016-07-02 ENCOUNTER — Other Ambulatory Visit: Payer: Self-pay | Admitting: Internal Medicine

## 2016-07-20 DIAGNOSIS — Z79899 Other long term (current) drug therapy: Secondary | ICD-10-CM | POA: Diagnosis not present

## 2016-07-26 DIAGNOSIS — M3390 Dermatopolymyositis, unspecified, organ involvement unspecified: Secondary | ICD-10-CM | POA: Diagnosis not present

## 2016-07-26 DIAGNOSIS — R21 Rash and other nonspecific skin eruption: Secondary | ICD-10-CM | POA: Diagnosis not present

## 2016-11-16 DIAGNOSIS — I350 Nonrheumatic aortic (valve) stenosis: Secondary | ICD-10-CM | POA: Diagnosis not present

## 2016-11-18 DIAGNOSIS — J069 Acute upper respiratory infection, unspecified: Secondary | ICD-10-CM | POA: Diagnosis not present

## 2016-11-25 ENCOUNTER — Other Ambulatory Visit: Payer: Self-pay | Admitting: Internal Medicine

## 2016-12-25 DIAGNOSIS — M170 Bilateral primary osteoarthritis of knee: Secondary | ICD-10-CM | POA: Diagnosis not present

## 2016-12-25 DIAGNOSIS — M3390 Dermatopolymyositis, unspecified, organ involvement unspecified: Secondary | ICD-10-CM | POA: Diagnosis not present

## 2016-12-25 DIAGNOSIS — M3500 Sicca syndrome, unspecified: Secondary | ICD-10-CM | POA: Diagnosis not present

## 2016-12-25 DIAGNOSIS — I350 Nonrheumatic aortic (valve) stenosis: Secondary | ICD-10-CM | POA: Diagnosis not present

## 2017-01-04 DIAGNOSIS — I350 Nonrheumatic aortic (valve) stenosis: Secondary | ICD-10-CM | POA: Diagnosis not present

## 2017-01-04 DIAGNOSIS — J441 Chronic obstructive pulmonary disease with (acute) exacerbation: Secondary | ICD-10-CM | POA: Diagnosis not present

## 2017-01-04 DIAGNOSIS — I700 Atherosclerosis of aorta: Secondary | ICD-10-CM | POA: Diagnosis not present

## 2017-04-25 DIAGNOSIS — Z79899 Other long term (current) drug therapy: Secondary | ICD-10-CM | POA: Diagnosis not present

## 2017-06-26 DIAGNOSIS — M3390 Dermatopolymyositis, unspecified, organ involvement unspecified: Secondary | ICD-10-CM | POA: Diagnosis not present

## 2017-06-26 DIAGNOSIS — M170 Bilateral primary osteoarthritis of knee: Secondary | ICD-10-CM | POA: Diagnosis not present

## 2017-06-26 DIAGNOSIS — M3500 Sicca syndrome, unspecified: Secondary | ICD-10-CM | POA: Diagnosis not present

## 2017-08-12 DIAGNOSIS — Z0000 Encounter for general adult medical examination without abnormal findings: Secondary | ICD-10-CM | POA: Diagnosis not present

## 2017-08-12 DIAGNOSIS — M3320 Polymyositis, organ involvement unspecified: Secondary | ICD-10-CM | POA: Diagnosis not present

## 2017-08-12 DIAGNOSIS — I10 Essential (primary) hypertension: Secondary | ICD-10-CM | POA: Diagnosis not present

## 2017-08-12 DIAGNOSIS — M064 Inflammatory polyarthropathy: Secondary | ICD-10-CM | POA: Diagnosis not present

## 2017-09-09 DIAGNOSIS — K219 Gastro-esophageal reflux disease without esophagitis: Secondary | ICD-10-CM | POA: Diagnosis not present

## 2017-09-09 DIAGNOSIS — K9289 Other specified diseases of the digestive system: Secondary | ICD-10-CM | POA: Diagnosis not present

## 2017-09-09 DIAGNOSIS — R1013 Epigastric pain: Secondary | ICD-10-CM | POA: Diagnosis not present

## 2017-10-18 DIAGNOSIS — M3500 Sicca syndrome, unspecified: Secondary | ICD-10-CM | POA: Diagnosis not present

## 2017-10-18 DIAGNOSIS — M170 Bilateral primary osteoarthritis of knee: Secondary | ICD-10-CM | POA: Diagnosis not present

## 2017-10-18 DIAGNOSIS — E559 Vitamin D deficiency, unspecified: Secondary | ICD-10-CM | POA: Diagnosis not present

## 2017-10-18 DIAGNOSIS — E782 Mixed hyperlipidemia: Secondary | ICD-10-CM | POA: Diagnosis not present

## 2017-10-18 DIAGNOSIS — M3390 Dermatopolymyositis, unspecified, organ involvement unspecified: Secondary | ICD-10-CM | POA: Diagnosis not present

## 2017-10-18 DIAGNOSIS — I10 Essential (primary) hypertension: Secondary | ICD-10-CM | POA: Diagnosis not present

## 2017-10-18 DIAGNOSIS — R7309 Other abnormal glucose: Secondary | ICD-10-CM | POA: Diagnosis not present

## 2017-10-18 DIAGNOSIS — Z79899 Other long term (current) drug therapy: Secondary | ICD-10-CM | POA: Diagnosis not present

## 2017-10-28 DIAGNOSIS — E559 Vitamin D deficiency, unspecified: Secondary | ICD-10-CM | POA: Diagnosis not present

## 2017-10-28 DIAGNOSIS — I10 Essential (primary) hypertension: Secondary | ICD-10-CM | POA: Diagnosis not present

## 2017-10-28 DIAGNOSIS — E782 Mixed hyperlipidemia: Secondary | ICD-10-CM | POA: Diagnosis not present

## 2017-11-15 DIAGNOSIS — J019 Acute sinusitis, unspecified: Secondary | ICD-10-CM | POA: Diagnosis not present

## 2017-11-20 DIAGNOSIS — G4485 Primary stabbing headache: Secondary | ICD-10-CM | POA: Diagnosis not present

## 2017-11-20 DIAGNOSIS — H7092 Unspecified mastoiditis, left ear: Secondary | ICD-10-CM | POA: Diagnosis not present

## 2017-11-20 DIAGNOSIS — H9202 Otalgia, left ear: Secondary | ICD-10-CM | POA: Diagnosis not present

## 2018-02-05 DIAGNOSIS — J019 Acute sinusitis, unspecified: Secondary | ICD-10-CM | POA: Diagnosis not present

## 2018-02-05 DIAGNOSIS — J40 Bronchitis, not specified as acute or chronic: Secondary | ICD-10-CM | POA: Diagnosis not present

## 2018-02-05 DIAGNOSIS — I10 Essential (primary) hypertension: Secondary | ICD-10-CM | POA: Diagnosis not present

## 2018-03-14 DIAGNOSIS — E782 Mixed hyperlipidemia: Secondary | ICD-10-CM | POA: Diagnosis not present

## 2018-03-14 DIAGNOSIS — R05 Cough: Secondary | ICD-10-CM | POA: Diagnosis not present

## 2018-03-14 DIAGNOSIS — J411 Mucopurulent chronic bronchitis: Secondary | ICD-10-CM | POA: Diagnosis not present

## 2018-03-14 DIAGNOSIS — I10 Essential (primary) hypertension: Secondary | ICD-10-CM | POA: Diagnosis not present

## 2018-05-09 DIAGNOSIS — M064 Inflammatory polyarthropathy: Secondary | ICD-10-CM | POA: Diagnosis not present

## 2018-05-09 DIAGNOSIS — I10 Essential (primary) hypertension: Secondary | ICD-10-CM | POA: Diagnosis not present

## 2018-05-09 DIAGNOSIS — R05 Cough: Secondary | ICD-10-CM | POA: Diagnosis not present

## 2018-05-23 DIAGNOSIS — R05 Cough: Secondary | ICD-10-CM | POA: Diagnosis not present

## 2018-08-19 ENCOUNTER — Other Ambulatory Visit: Payer: Self-pay | Admitting: Internal Medicine

## 2018-08-19 DIAGNOSIS — R0789 Other chest pain: Secondary | ICD-10-CM

## 2018-08-19 DIAGNOSIS — R053 Chronic cough: Secondary | ICD-10-CM

## 2018-08-19 DIAGNOSIS — R05 Cough: Secondary | ICD-10-CM

## 2018-08-27 ENCOUNTER — Other Ambulatory Visit: Payer: Self-pay

## 2018-08-27 ENCOUNTER — Ambulatory Visit
Admission: RE | Admit: 2018-08-27 | Discharge: 2018-08-27 | Disposition: A | Discharge status: Home / Self Care | Payer: 59 | Source: Ambulatory Visit | Attending: Internal Medicine | Admitting: Internal Medicine

## 2018-08-27 DIAGNOSIS — R053 Chronic cough: Secondary | ICD-10-CM

## 2018-08-27 DIAGNOSIS — R05 Cough: Secondary | ICD-10-CM | POA: Insufficient documentation

## 2018-08-27 DIAGNOSIS — R0789 Other chest pain: Secondary | ICD-10-CM | POA: Insufficient documentation

## 2018-09-04 ENCOUNTER — Other Ambulatory Visit: Payer: Self-pay | Admitting: Pulmonary Disease

## 2018-09-04 DIAGNOSIS — J42 Unspecified chronic bronchitis: Secondary | ICD-10-CM

## 2018-11-12 ENCOUNTER — Other Ambulatory Visit: Payer: Self-pay | Admitting: Internal Medicine

## 2018-11-12 DIAGNOSIS — R1310 Dysphagia, unspecified: Secondary | ICD-10-CM

## 2018-11-17 ENCOUNTER — Ambulatory Visit: Admission: RE | Admit: 2018-11-17 | Payer: 59 | Source: Ambulatory Visit

## 2018-11-25 ENCOUNTER — Ambulatory Visit
Admission: RE | Admit: 2018-11-25 | Discharge: 2018-11-25 | Disposition: A | Discharge status: Home / Self Care | Payer: 59 | Source: Ambulatory Visit | Attending: Pulmonary Disease | Admitting: Pulmonary Disease

## 2018-11-25 ENCOUNTER — Other Ambulatory Visit: Payer: Self-pay

## 2018-11-25 ENCOUNTER — Ambulatory Visit: Admission: RE | Admit: 2018-11-25 | Payer: 59 | Source: Ambulatory Visit

## 2018-11-25 DIAGNOSIS — J42 Unspecified chronic bronchitis: Secondary | ICD-10-CM | POA: Diagnosis present

## 2018-11-25 HISTORY — DX: Essential (primary) hypertension: I10

## 2018-11-25 LAB — POCT I-STAT CREATININE: Creatinine, Ser: 0.9 mg/dL (ref 0.44–1.00)

## 2018-11-25 MED ORDER — IOHEXOL 300 MG/ML  SOLN
75.0000 mL | Freq: Once | INTRAMUSCULAR | Status: AC | PRN
  Administered 2018-11-25: 10:00:00 75 mL via INTRAVENOUS

## 2018-11-27 ENCOUNTER — Other Ambulatory Visit: Payer: Self-pay

## 2018-11-27 ENCOUNTER — Other Ambulatory Visit
Admission: RE | Admit: 2018-11-27 | Discharge: 2018-11-27 | Disposition: A | Discharge status: Home / Self Care | Payer: 59 | Source: Ambulatory Visit | Attending: Pulmonary Disease | Admitting: Pulmonary Disease

## 2018-11-27 ENCOUNTER — Other Ambulatory Visit: Payer: Self-pay | Admitting: Pulmonary Disease

## 2018-11-27 ENCOUNTER — Ambulatory Visit
Admission: RE | Admit: 2018-11-27 | Discharge: 2018-11-27 | Disposition: A | Discharge status: Home / Self Care | Payer: 59 | Source: Ambulatory Visit | Attending: Diagnostic Radiology | Admitting: Diagnostic Radiology

## 2018-11-27 ENCOUNTER — Ambulatory Visit
Admission: RE | Admit: 2018-11-27 | Discharge: 2018-11-27 | Disposition: A | Discharge status: Home / Self Care | Payer: 59 | Source: Ambulatory Visit | Attending: Pulmonary Disease | Admitting: Pulmonary Disease

## 2018-11-27 DIAGNOSIS — Z20828 Contact with and (suspected) exposure to other viral communicable diseases: Secondary | ICD-10-CM | POA: Diagnosis not present

## 2018-11-27 DIAGNOSIS — J90 Pleural effusion, not elsewhere classified: Secondary | ICD-10-CM

## 2018-11-27 DIAGNOSIS — Z9889 Other specified postprocedural states: Secondary | ICD-10-CM | POA: Insufficient documentation

## 2018-11-27 DIAGNOSIS — Z01812 Encounter for preprocedural laboratory examination: Secondary | ICD-10-CM | POA: Diagnosis present

## 2018-11-27 LAB — SARS CORONAVIRUS 2 BY RT PCR (HOSPITAL ORDER, PERFORMED IN ~~LOC~~ HOSPITAL LAB): SARS Coronavirus 2: NEGATIVE

## 2018-11-27 LAB — BODY FLUID CELL COUNT WITH DIFFERENTIAL
Eos, Fluid: 5 %
Lymphs, Fluid: 36 %
Monocyte-Macrophage-Serous Fluid: 58 %
Neutrophil Count, Fluid: 1 %
Total Nucleated Cell Count, Fluid: 6885 cu mm

## 2018-11-27 LAB — LACTATE DEHYDROGENASE, PLEURAL OR PERITONEAL FLUID: LD, Fluid: 251 U/L — ABNORMAL HIGH (ref 3–23)

## 2018-11-27 LAB — GLUCOSE, PLEURAL OR PERITONEAL FLUID: Glucose, Fluid: 96 mg/dL

## 2018-11-27 NOTE — Procedures (Signed)
Right thoracentesis without difficulty  Complications:  None  Blood Loss: none  See dictation in canopy pacs   

## 2018-11-28 LAB — PH, BODY FLUID: pH, Body Fluid: 7.5

## 2018-11-30 LAB — BODY FLUID CULTURE: Culture: NO GROWTH

## 2018-12-01 LAB — CYTOLOGY - NON PAP

## 2019-01-21 ENCOUNTER — Other Ambulatory Visit: Payer: Self-pay

## 2019-01-21 DIAGNOSIS — Z20822 Contact with and (suspected) exposure to covid-19: Secondary | ICD-10-CM

## 2019-01-24 LAB — NOVEL CORONAVIRUS, NAA: SARS-CoV-2, NAA: NOT DETECTED

## 2019-02-18 ENCOUNTER — Other Ambulatory Visit: Payer: Self-pay | Admitting: Pulmonary Disease

## 2019-02-18 DIAGNOSIS — J90 Pleural effusion, not elsewhere classified: Secondary | ICD-10-CM

## 2019-02-19 ENCOUNTER — Other Ambulatory Visit
Admission: RE | Admit: 2019-02-19 | Discharge: 2019-02-19 | Disposition: A | Discharge status: Home / Self Care | Payer: 59 | Source: Ambulatory Visit | Attending: Pulmonary Disease | Admitting: Pulmonary Disease

## 2019-02-19 ENCOUNTER — Other Ambulatory Visit: Payer: Self-pay

## 2019-02-19 DIAGNOSIS — Z20828 Contact with and (suspected) exposure to other viral communicable diseases: Secondary | ICD-10-CM | POA: Insufficient documentation

## 2019-02-19 DIAGNOSIS — Z01812 Encounter for preprocedural laboratory examination: Secondary | ICD-10-CM | POA: Diagnosis present

## 2019-02-19 LAB — SARS CORONAVIRUS 2 (TAT 6-24 HRS): SARS Coronavirus 2: NEGATIVE

## 2019-02-23 ENCOUNTER — Ambulatory Visit: Payer: 59

## 2019-02-23 ENCOUNTER — Ambulatory Visit
Admission: RE | Admit: 2019-02-23 | Discharge: 2019-02-23 | Disposition: A | Discharge status: Home / Self Care | Payer: 59 | Source: Ambulatory Visit | Attending: Pulmonary Disease | Admitting: Pulmonary Disease

## 2019-02-23 ENCOUNTER — Other Ambulatory Visit: Payer: Self-pay

## 2019-02-23 ENCOUNTER — Ambulatory Visit
Admission: RE | Admit: 2019-02-23 | Discharge: 2019-02-23 | Disposition: A | Discharge status: Home / Self Care | Payer: 59 | Source: Ambulatory Visit | Attending: Diagnostic Radiology | Admitting: Diagnostic Radiology

## 2019-02-23 DIAGNOSIS — M3500 Sicca syndrome, unspecified: Secondary | ICD-10-CM | POA: Insufficient documentation

## 2019-02-23 DIAGNOSIS — D72820 Lymphocytosis (symptomatic): Secondary | ICD-10-CM | POA: Insufficient documentation

## 2019-02-23 DIAGNOSIS — R05 Cough: Secondary | ICD-10-CM | POA: Insufficient documentation

## 2019-02-23 DIAGNOSIS — Z9889 Other specified postprocedural states: Secondary | ICD-10-CM

## 2019-02-23 DIAGNOSIS — J984 Other disorders of lung: Secondary | ICD-10-CM | POA: Insufficient documentation

## 2019-02-23 DIAGNOSIS — J90 Pleural effusion, not elsewhere classified: Secondary | ICD-10-CM | POA: Insufficient documentation

## 2019-02-23 LAB — LACTATE DEHYDROGENASE, PLEURAL OR PERITONEAL FLUID: LD, Fluid: 240 U/L — ABNORMAL HIGH (ref 3–23)

## 2019-02-23 LAB — PROTEIN, PLEURAL OR PERITONEAL FLUID: Total protein, fluid: 4.2 g/dL

## 2019-02-23 LAB — BODY FLUID CELL COUNT WITH DIFFERENTIAL
Eos, Fluid: 0 %
Lymphs, Fluid: 92 %
Monocyte-Macrophage-Serous Fluid: 6 %
Neutrophil Count, Fluid: 2 %
Total Nucleated Cell Count, Fluid: 2558 cu mm

## 2019-02-23 LAB — GLUCOSE, PLEURAL OR PERITONEAL FLUID: Glucose, Fluid: 103 mg/dL

## 2019-02-23 NOTE — Procedures (Signed)
Interventional Radiology Procedure:   Indications: Coughing and right pleural effusion.  Procedure: US guided thoracentesis  Findings: Removed 100 ml from right chest.  Complications: None     EBL: Less than 10 ml  Plan: Follow up CXR   Katrina Martinez R. Anselm Pancoast, MD  Pager: [phone]

## 2019-02-25 LAB — CYTOLOGY - NON PAP

## 2019-02-26 LAB — ACID FAST SMEAR (AFB, MYCOBACTERIA): Acid Fast Smear: NEGATIVE

## 2019-02-27 LAB — BODY FLUID CULTURE: Culture: NO GROWTH

## 2019-03-26 LAB — FUNGUS CULTURE WITH STAIN

## 2019-03-26 LAB — FUNGAL ORGANISM REFLEX

## 2019-03-26 LAB — FUNGUS CULTURE RESULT

## 2019-04-09 LAB — ACID FAST CULTURE WITH REFLEXED SENSITIVITIES (MYCOBACTERIA): Acid Fast Culture: NEGATIVE

## 2019-11-30 ENCOUNTER — Other Ambulatory Visit: Payer: Self-pay | Admitting: Internal Medicine

## 2019-11-30 DIAGNOSIS — Z1231 Encounter for screening mammogram for malignant neoplasm of breast: Secondary | ICD-10-CM

## 2019-12-01 ENCOUNTER — Other Ambulatory Visit: Payer: Self-pay | Admitting: Internal Medicine

## 2019-12-01 DIAGNOSIS — R9389 Abnormal findings on diagnostic imaging of other specified body structures: Secondary | ICD-10-CM

## 2019-12-01 DIAGNOSIS — J90 Pleural effusion, not elsewhere classified: Secondary | ICD-10-CM

## 2019-12-10 ENCOUNTER — Other Ambulatory Visit: Payer: Self-pay

## 2019-12-10 ENCOUNTER — Ambulatory Visit
Admission: RE | Admit: 2019-12-10 | Discharge: 2019-12-10 | Disposition: A | Discharge status: Home / Self Care | Payer: 59 | Source: Ambulatory Visit | Attending: Internal Medicine | Admitting: Internal Medicine

## 2019-12-10 DIAGNOSIS — J90 Pleural effusion, not elsewhere classified: Secondary | ICD-10-CM | POA: Diagnosis present

## 2019-12-10 DIAGNOSIS — R9389 Abnormal findings on diagnostic imaging of other specified body structures: Secondary | ICD-10-CM | POA: Insufficient documentation

## 2020-10-27 IMAGING — CT CT CHEST W/ CM
2 of 4 series · 15 of 36 positions shown, 18 images · IV contrast (omnipaque)
Comparison: Chest x-ray 08/27/2018.

CLINICAL DATA: 63-year-old female with history of productive cough,
dysphagia, chest tightness and shortness of breath.

EXAM:
CT CHEST WITH CONTRAST
TECHNIQUE: Multidetector CT imaging of the chest was performed during
intravenous contrast administration.
CONTRAST:  75mL OMNIPAQUE IOHEXOL 300 MG/ML  SOLN

[Series 2: axial chest · axial · 0.59mm/px · z∈[-1253,-989]mm · 12 of 157 slices shown, 15 images]
[im 13/157  mediastinal]
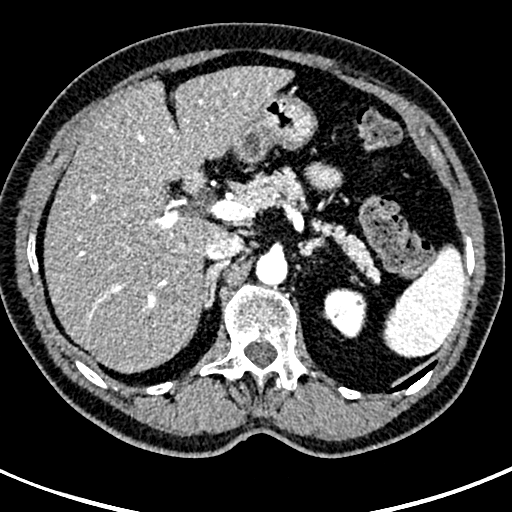
[im 13/157  lung]
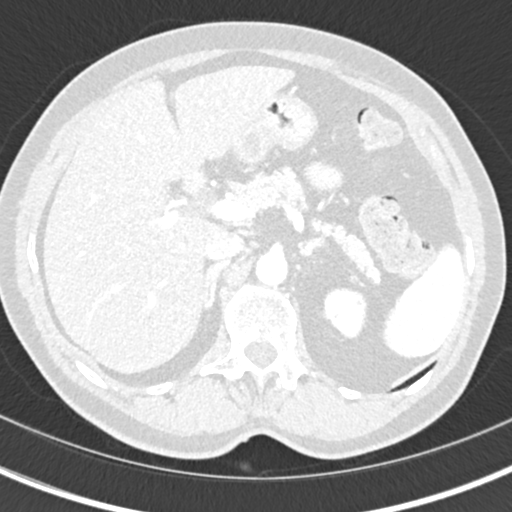
[im 25/157  lung]
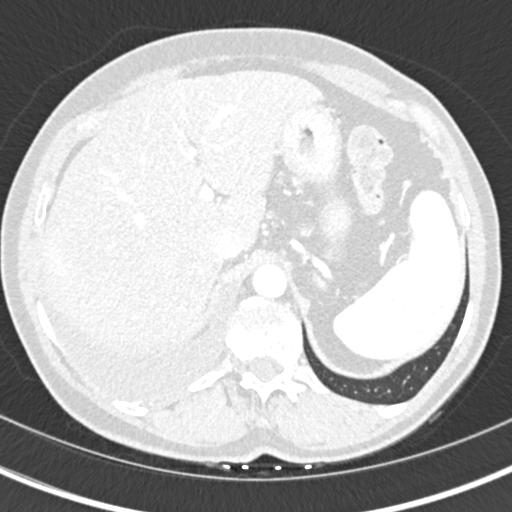
[im 37/157  lung]
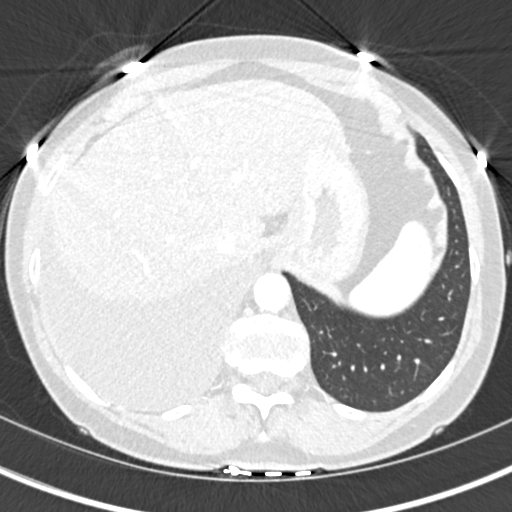
[im 49/157  lung]
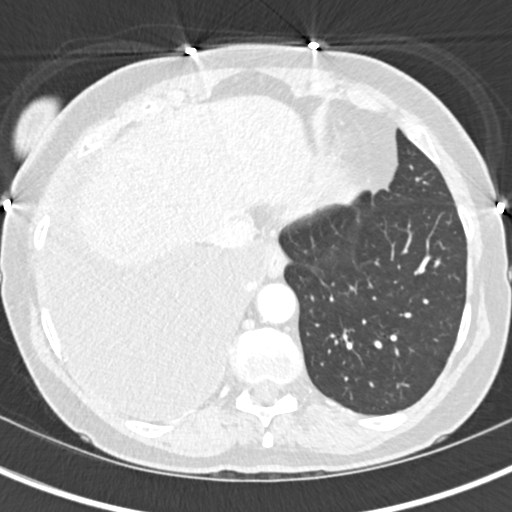
[im 61/157  mediastinal]
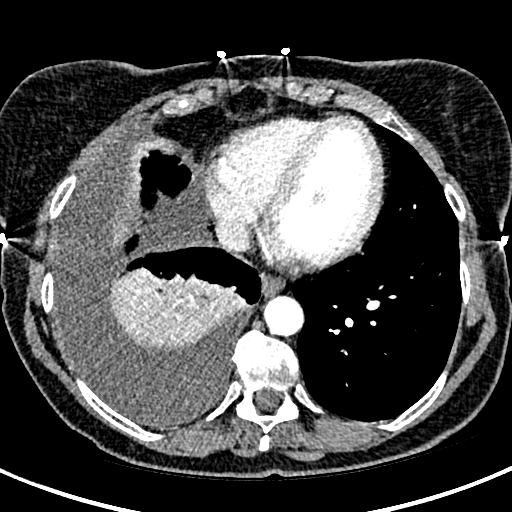
[im 61/157  lung]
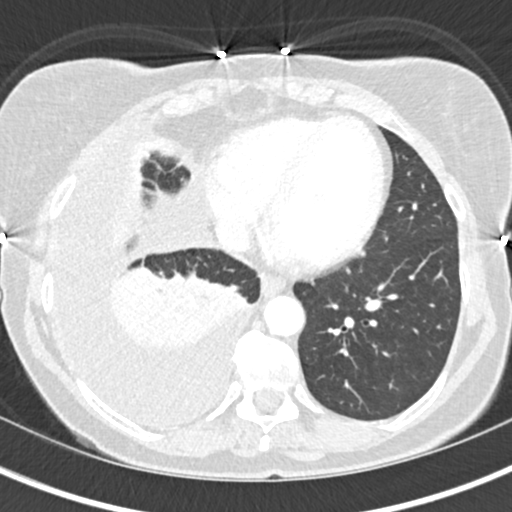
[im 73/157  lung]
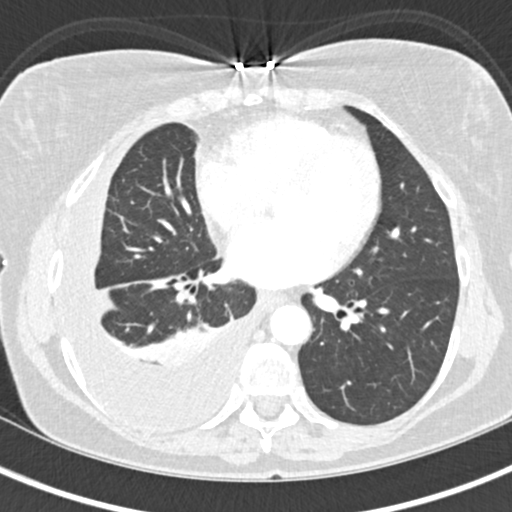
[im 85/157  lung]
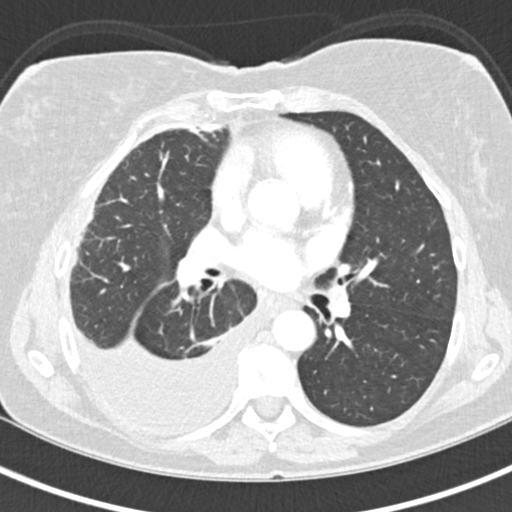
[im 97/157  lung]
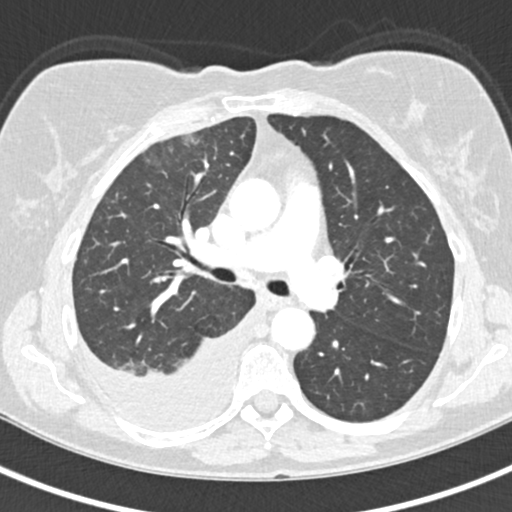
[im 109/157  mediastinal]
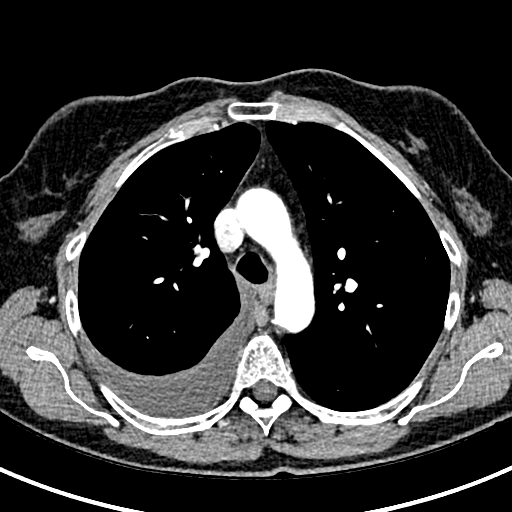
[im 109/157  lung]
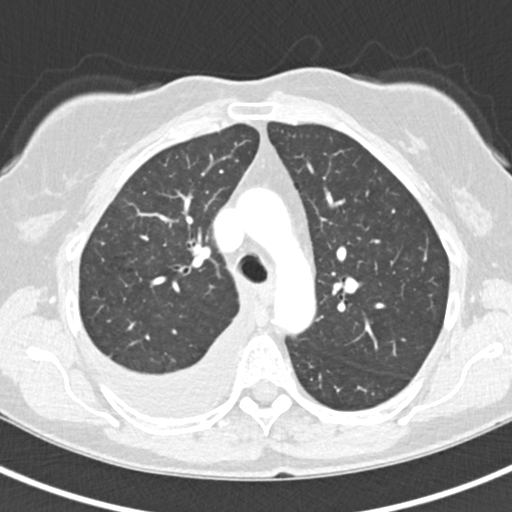
[im 121/157  lung]
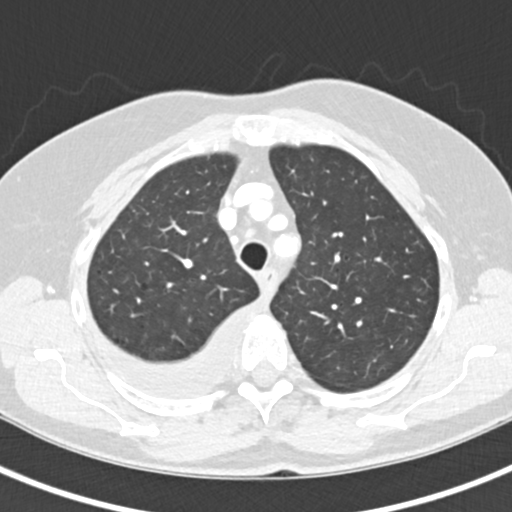
[im 133/157  lung]
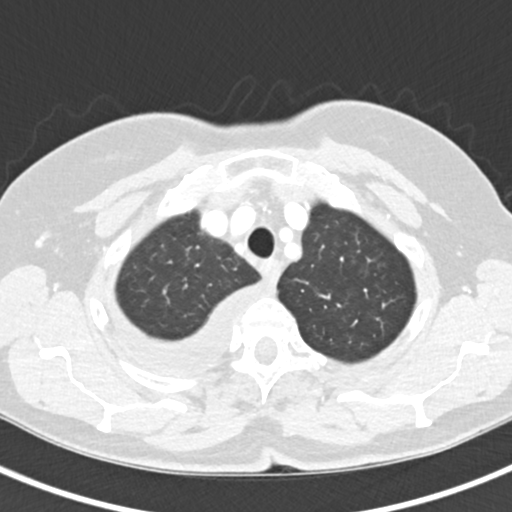
[im 145/157  lung]
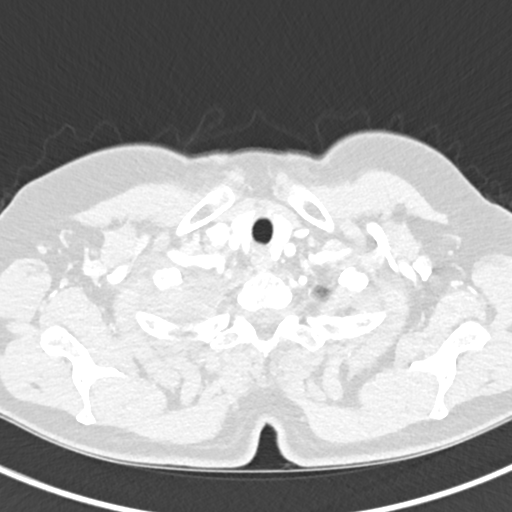

[Series 4: coronal chest · coronal · 0.59mm/px · 3 of 138 slices shown]
[im 28/138  lung]
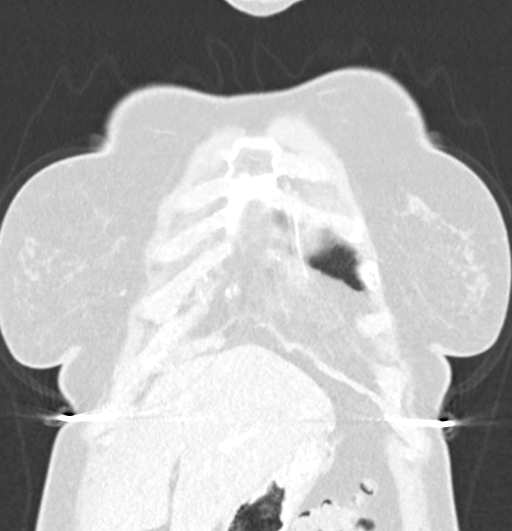
[im 55/138  lung]
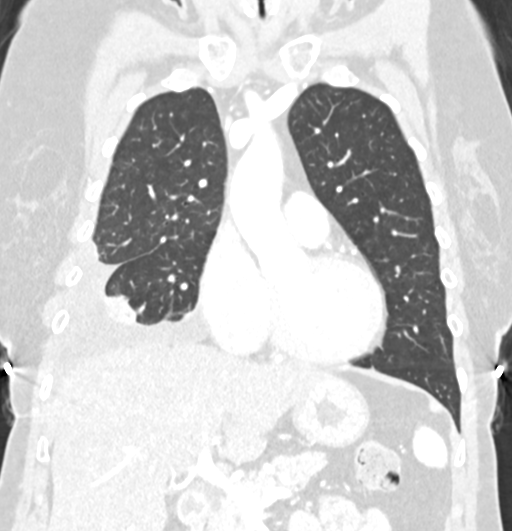
[im 83/138  lung]
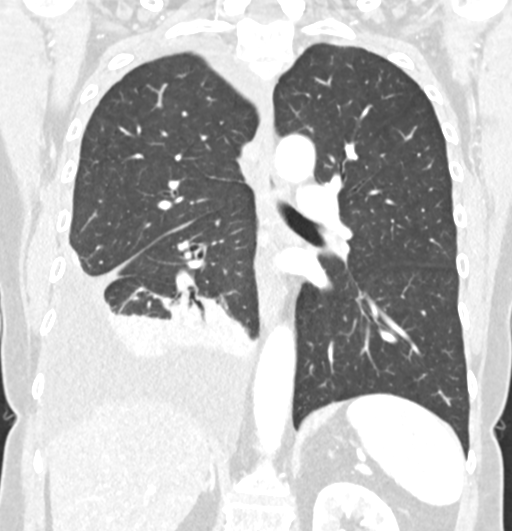

[15 of 36 positions shown; findings below may reference images not displayed]

FINDINGS: Cardiovascular: Heart size is normal. There is no significant
pericardial fluid, thickening or pericardial calcification.
Thickening calcification of the aortic valve. Aortic
atherosclerosis. No definite coronary artery calcifications.

Mediastinum/Nodes: No pathologically enlarged mediastinal or hilar
lymph nodes. Esophagus is unremarkable in appearance. No axillary
lymphadenopathy.

Lungs/Pleura: Moderate to large right pleural effusion, slightly
increased compared to the prior study. This is associated with areas
of passive atelectasis in the right middle and lower lobes. No
consolidative airspace disease. No suspicious appearing pulmonary
nodules or masses are noted.

Upper Abdomen: Diffuse low attenuation throughout the hepatic
parenchyma, indicative of hepatic steatosis. Aortic atherosclerosis.

Musculoskeletal: There are no aggressive appearing lytic or blastic
lesions noted in the visualized portions of the skeleton.
IMPRESSION: 1. Enlarging moderate to large right pleural effusion with areas of
passive atelectasis in the right middle and right lower lobes.
2. Aortic atherosclerosis.
3. There are calcifications of the aortic valve. Echocardiographic
correlation for evaluation of potential valvular dysfunction may be
warranted if clinically indicated.
4. Severe hepatic steatosis.

Aortic Atherosclerosis ([id number]).

## 2020-10-29 IMAGING — US US THORACENTESIS ASP PLEURAL SPACE W/IMG GUIDE
1 series · 2 of 2 positions shown · non-contrast
Comparison: none

INDICATION: Moderate right-sided pleural effusion

[Series 1: us thoracentesis asp pleural space w/img guide · 0.25mm/px · 2 of 2 slices shown]
[im 1/2]
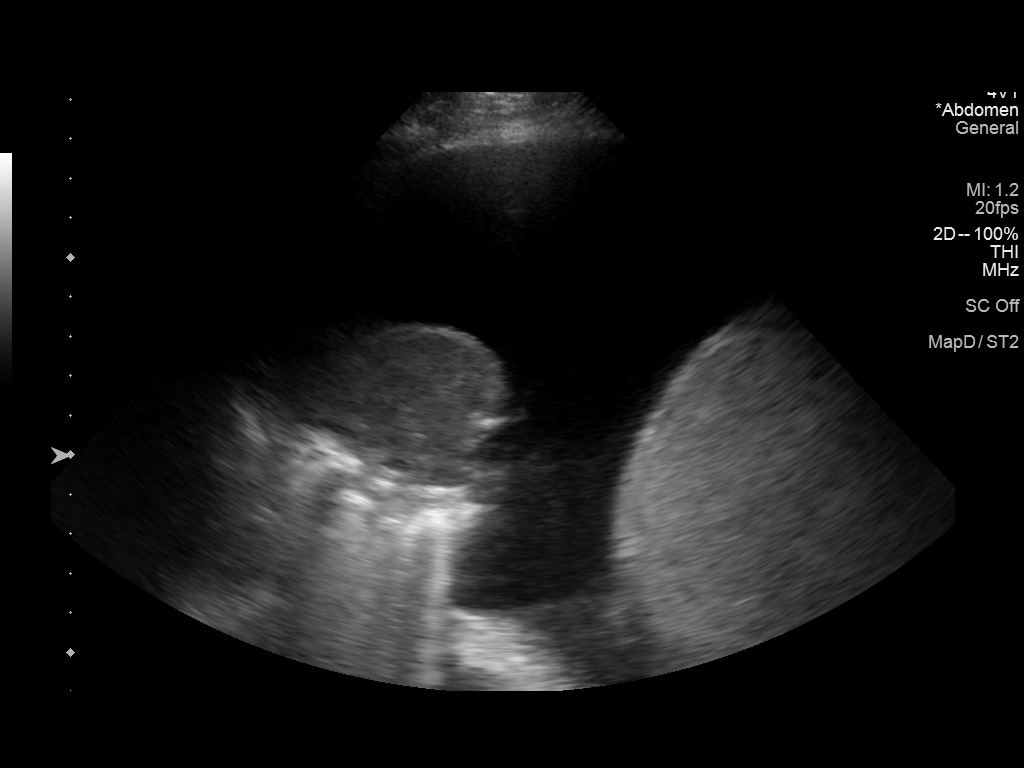
[im 2/2]
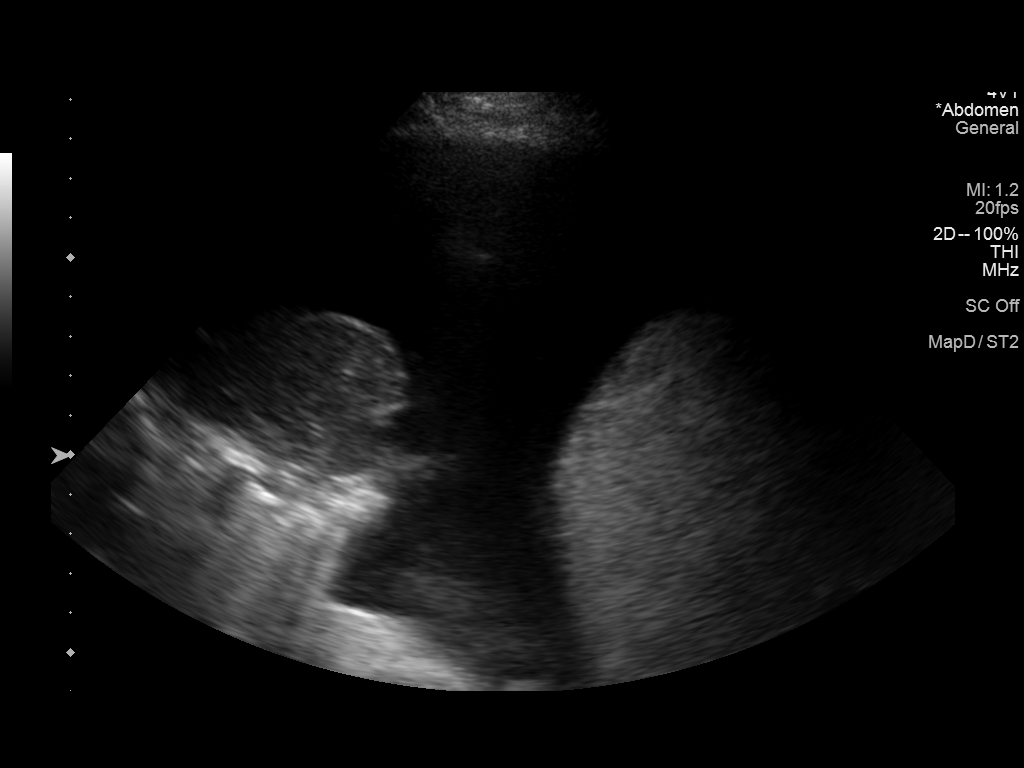

[2 of 2 positions shown; findings below may reference images not displayed]

EXAM:
ULTRASOUND GUIDED THORACENTESIS

MEDICATIONS:
None.

COMPLICATIONS:
None immediate.

PROCEDURE:
An ultrasound guided thoracentesis was thoroughly discussed with the
patient and questions answered. The benefits, risks, alternatives
and complications were also discussed. The patient understands and
wishes to proceed with the procedure. Written consent was obtained.

Ultrasound was performed to localize and mark an adequate pocket of
fluid in the right chest. The area was then prepped and draped in
the normal sterile fashion. 1% Lidocaine was used for local
anesthesia. Under ultrasound guidance a 6 Fr Safe-T-Centesis
catheter was introduced. Thoracentesis was performed. The catheter
was removed and a dressing applied.
FINDINGS: A total of approximately 1.1 L of amber fluid was removed. Samples
were sent to the laboratory as requested by the clinical team.
IMPRESSION: Successful ultrasound guided right thoracentesis yielding 1.1 L of
pleural fluid.

## 2020-10-29 IMAGING — CR DG CHEST 1V PORT
1 series · 1 of 1 positions shown · non-contrast
Comparison: CT from 11/25/2018

CLINICAL DATA: Status post right thoracentesis

EXAM:
PORTABLE CHEST 1 VIEW

[dg chest port 1 view]
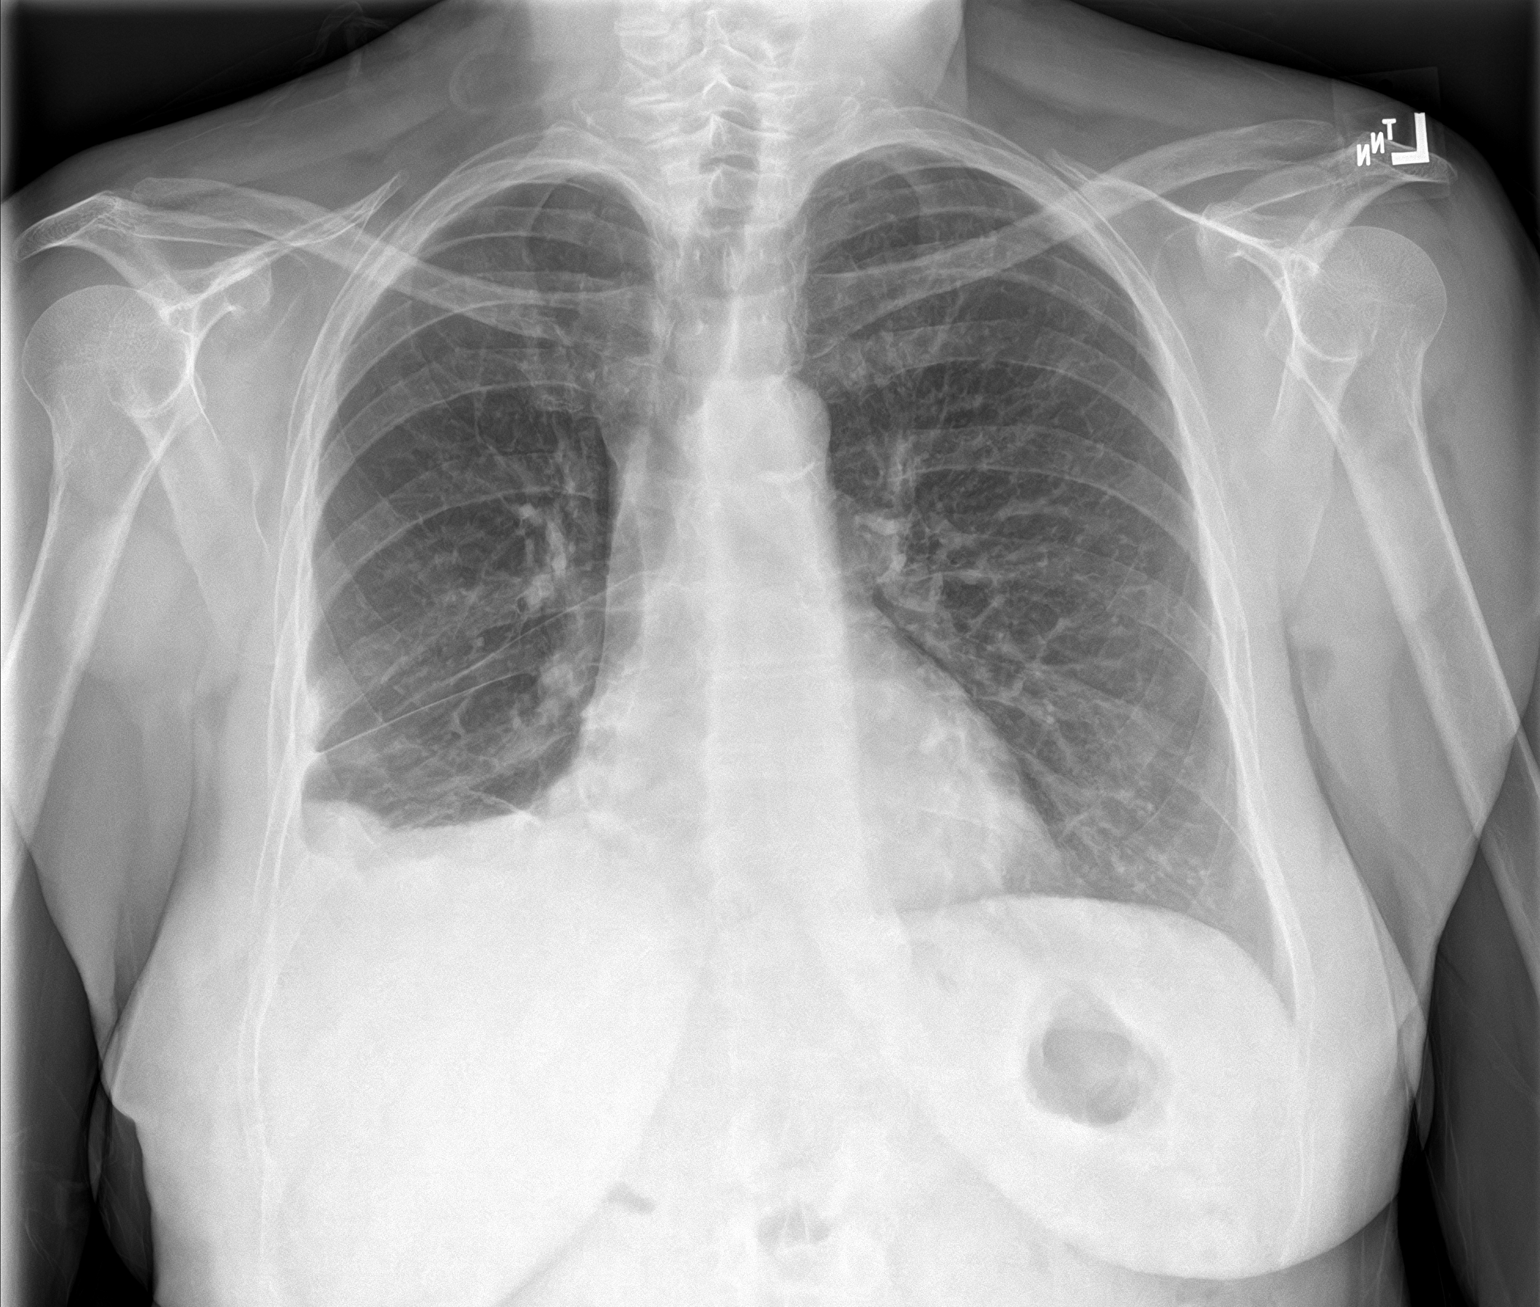

[1 of 1 positions shown; findings below may reference images not displayed]

FINDINGS: Cardiac shadow is within normal limits. Aortic calcifications are
seen. Left lung is clear. Right lung demonstrates interval
thoracentesis with near complete resolution of the right-sided
pleural effusion. No pneumothorax is noted. Minimal right basilar
atelectasis is seen.
IMPRESSION: No pneumothorax following right thoracentesis. Right basilar
atelectasis is noted.

## 2021-09-29 ENCOUNTER — Other Ambulatory Visit: Payer: Self-pay | Admitting: Internal Medicine

## 2021-09-29 DIAGNOSIS — R053 Chronic cough: Secondary | ICD-10-CM

## 2021-10-02 ENCOUNTER — Other Ambulatory Visit: Payer: Self-pay | Admitting: Internal Medicine

## 2021-10-02 DIAGNOSIS — Z1231 Encounter for screening mammogram for malignant neoplasm of breast: Secondary | ICD-10-CM

## 2021-11-06 ENCOUNTER — Ambulatory Visit
Admission: RE | Admit: 2021-11-06 | Discharge: 2021-11-06 | Disposition: A | Discharge status: Home / Self Care | Payer: Medicare Other | Source: Ambulatory Visit | Attending: Internal Medicine | Admitting: Internal Medicine

## 2021-11-06 DIAGNOSIS — Z1231 Encounter for screening mammogram for malignant neoplasm of breast: Secondary | ICD-10-CM | POA: Diagnosis present

## 2022-04-02 ENCOUNTER — Encounter: Admission: RE | Payer: Self-pay | Source: Ambulatory Visit

## 2022-04-02 ENCOUNTER — Ambulatory Visit: Admission: RE | Admit: 2022-04-02 | Payer: Medicare Other | Source: Ambulatory Visit | Admitting: Gastroenterology

## 2022-04-02 SURGERY — COLONOSCOPY
Anesthesia: General

## 2022-07-02 ENCOUNTER — Encounter: Payer: Self-pay | Admitting: *Deleted

## 2023-06-17 NOTE — Progress Notes (Signed)
 Katrina Martinez is a 68 y.o. here for Medicare Wellness Visit  MEDICARE WELLNESS VISIT  Providers Rendering Care 1. Dr. Reyes Costa (PCP) Pt in NAD. BP elevated. Has HLD not on statin, vitamin D  def on po supplement and hyperglycemia not on meds. Also with anxiety and chronic pain. Weight stable. Still with back pain. Has DDD on plain films. No fever. Denies CP or SOB. No palpitations. No change in bowels or bladder.  Functional Assessment (1) Hearing: Demonstrates no difficulty in hearing during normal conversation (2) Risk of Falls: Patient has fallen in the last year, Gait steady without assistance during walk from waiting area to exam room (3) Home Safety: Patient feels secure in their home, There are operational smoke alarms in multiple areas of the home (4) Activities of Daily Living: Independently manages personal grooming and household chores, including cooking, cleaning and laundry. Manages Personal finances without assistance.  Depression Screening PHQ 2/9 last 3 flowsheet values     11/12/2018    1:38 PM 04/12/2021    4:35 PM  PHQ-2/9 Depression Screening   (OBSOLETE) Little interest or pleasure in doing things 1 0  (OBSOLETE) Feeling down, depressed, or hopeless (or irritable for Teens only)? 1 0  (OBSOLETE) Total Prescreening Score 2 0  (OBSOLETE) Total Score = 2 0     Depression Severity and Treatment Recommendations:  0-4= None  5-9= Mild / Treatment: Support, educate to call if worse; return in one month  10-14= Moderate / Treatment: Support, watchful waiting; Antidepressant or Psychotherapy  15-19= Moderately severe / Treatment: Antidepressant OR Psychotherapy  >= 20 = Major depression, severe / Antidepressant AND Psychotherapy   Cognitive Impairment Patient denies episodes of loosing things, being forgetful. Seems oriented to person, place and time.  Responses appear appropriate and timely to this observer.  PREVENTION PLAN  Cardiovascular: FLP assessed  LDL=145 Diabetes: A1c or FBG assessed A1c=6.1 Glaucoma: N/A Hepatitis B (HBV) Vaccine:  Not Applicable Smoking Cessation:  encouraged to quit  Other Personalized Health Advice  Encouraged patient to exercise 5 days a week, walking, water aerobics, gentle stretching recommended. Increase dietary intake of fresh fruits and vegetables, reduce red meat to twice a week.  End of Life Counseling Patient has living will in place; POA - ; Full Code  Current Outpatient Medications  Medication Sig Dispense Refill  . aspirin/acetaminophen /caffeine (GOODY'S EXTRA STRENGTH ORAL) Take by mouth as needed    . HYDROcodone -acetaminophen  2.5-325 mg Tab Take by mouth    . meloxicam (MOBIC) 15 MG tablet Take 1 tablet (15 mg total) by mouth once daily 30 tablet 0  . albuterol  90 mcg/actuation inhaler Inhale 2 inhalations into the lungs every 6 (six) hours as needed for Wheezing 1 each 1  . FUROsemide (LASIX) 20 MG tablet Take 1 tablet (20 mg total) by mouth once daily (Patient not taking: Reported on 06/17/2023) 30 tablet 11   No current facility-administered medications for this visit.    Allergies as of 06/17/2023 - Reviewed 06/17/2023  Allergen Reaction Noted  . Bacitracin-polymyxin b Unknown 08/02/2014  . Benzonatate  Nausea 08/02/2014  . Hydrocortisone Unknown and Other (See Comments) 08/02/2014  . Latex Unknown 08/02/2014  . Hydrogen peroxide Rash 08/02/2014  . Pregabalin  Other (See Comments) 02/27/2016    Patient Active Problem List  Diagnosis  . Allergic rhinitis  . Anxiety state  . Mucopurulent chronic bronchitis (CMS-HCC)  . Cough  . Elevated blood-pressure reading without diagnosis of hypertension  . Diffuse cystic mastopathy  . Neuromyositis  .  Other drug induced headache  . Tobacco abuse  . Polyarthropathy or polyarthritis of multiple sites  . Disorder of skin and subcutaneous tissue  . Dermatomyositis (CMS/HHS-HCC)  . Chest pain with low risk for cardiac etiology  . Cardiac  murmur, unspecified  . Aortic valve stenosis, mild  . Atherosclerosis of aorta ()  . Nonrheumatic aortic valve stenosis  . Chest pain  . Other chronic pain  . Polymyositis (CMS/HHS-HCC)  . Dermatopolymyositis (CMS/HHS-HCC)  . Disorder of skin or subcutaneous tissue  . Elevated blood pressure reading without diagnosis of hypertension  . Diffuse cystic mastopathy of breast  . Anxiety disorder  . Fibromyalgia  . Headache(784.0)  . Drug-induced headache, not elsewhere classified, intractable  . Personal history of nicotine dependence  . Localized swelling, mass, and lump of head  . Inflammatory polyarthropathy (CMS/HHS-HCC)  . Polyarthritis  . Encounter for general adult medical examination without abnormal findings  . Tobacco abuse counseling  . Benign neoplasm of colon  . Closed rib fracture  . Fibrositis  . Obstructive chronic bronchitis with exacerbation (CMS/HHS-HCC)  . Skin lesion  . Vitamin D  deficiency  . Overweight  . Hyperlipidemia  . HTN, goal below 140/80  . Pedal edema  . Pleural effusion  . Hyperglycemia  . Prediabetes    Past Medical History:  Diagnosis Date  . Allergic state   . Anxiety   . Arthritis   . Dermatomyositis (CMS/HHS-HCC)   . Fibromyalgia syndrome   . Hyperlipidemia   . Pleural effusion   . Sjogren's syndrome (CMS/HHS-HCC)     Past Surgical History:  Procedure Laterality Date  . COLONOSCOPY  10/28/2014   Dr. FABIENE Holmes @ [facility]. Adenomatous Polyps: Recall ltr mailed 08/12/17 (kj)  . EGD  09/19/2015   Gastritis: No repeat per RTE  . EGD  09/19/2015   Gastritis, Small Ulcer in Stomach: No repeat per RTE  . APPENDECTOMY    . dental surgery    . HYSTERECTOMY    . OOPHORECTOMY    . TONSILLECTOMY    . TUBAL LIGATION      Health Maintenance  Topic Date Due  . Hepatitis C Screen  Never done  . COVID-19 Vaccine (1) Never done  . Shingrix (1 of 2) Never done  . Pneumococcal Vaccine: 50+ (1 of 1 - PCV) Never done  . RSV  Immunization Pregnant or 60+ (1 - Risk 60-74 years 1-dose series) Never done  . Colorectal Cancer Screening  10/27/2017  . Depression Screening  04/12/2022  . Annual Physical/Well Child Check  04/13/2022  . Mammogram  11/07/2022  . Potassium Level  12/28/2022  . Lipid Panel  12/28/2022  . Serum Calcium   12/28/2022  . Influenza Vaccine (Season Ended) 10/21/2023  . DXA Bone Density Scan  11/20/2023  . Medicare Subsequent AWV [id number]  06/17/2024  . Diabetes Screening  12/27/2024  . Adult Tetanus (Td And Tdap)  09/14/2029  . Hib Vaccines  Aged Out  . Hepatitis A Vaccines  Aged Out  . Meningococcal B Vaccine  Aged Out  . Meningococcal ACWY Vaccine  Aged Out  . HPV Vaccines  Aged Out    Vitals:   06/17/23 1536  BP: (!) 162/90  Pulse: 90  SpO2: 96%  Weight: 68.5 kg (151 lb)  Height: 162.6 cm (5' 4)  PainSc:   6  PainLoc: Shoulder   Body mass index is 25.92 kg/m. General: Alert oriented x3    Eyes: Sclera and conjunctiva clear; pupils equal round and  reactive to light and accommodation; extraocular movements intact Nose: Mucosa healthy without drainage or ulceration Oropharynx: No suspicious lesions Neck: No swelling, masses, stiffness, pain, limited movement, carotid pulses normal bilaterally, thyroid  normal size, no masses palpated. No bruits heard. Lungs: Respirations unlabored; clear to auscultation bilaterally Back: No spinal deformity Cardiovascular: Heart regular rate and rhythm without murmurs, gallops, or rubs Abdomen: Soft; non tender; non distended;  no masses or organomegaly Lymph Nodes: No significant cervical, supraclavicular, or axillary lymphadenopathy noted Musculoskeletal: No active joint inflammation Extremities: Normal, no edema Pulses: Dorsalis pedis palpable and symmetric bilaterally Neurologic: Alert and oriented; speech intact; face symmetrical; moves all extremities well    Assessment/Plan  1. Medicare wellness visit- Medications and allergies  reviewed. Copy of preventative health provided.  Labs reviewed. 2. Back pain- resume Mobic 3. Tobacco use- stop smoking, CXR today 4. HLD- diet/exercise, labs today 5. Prediabetes- diet/exercise/water, labs today 6. HTN- add Inderal 7. RTC 4 mo, sooner if needed Reyes JONETTA Costa, MD, MD

## 2023-06-18 ENCOUNTER — Other Ambulatory Visit: Payer: Self-pay | Admitting: Internal Medicine

## 2023-06-18 DIAGNOSIS — Z1231 Encounter for screening mammogram for malignant neoplasm of breast: Secondary | ICD-10-CM

## 2023-07-29 ENCOUNTER — Other Ambulatory Visit: Payer: Self-pay | Admitting: Internal Medicine

## 2023-07-29 DIAGNOSIS — M545 Low back pain, unspecified: Secondary | ICD-10-CM

## 2023-08-07 ENCOUNTER — Ambulatory Visit
Admission: RE | Admit: 2023-08-07 | Discharge: 2023-08-07 | Discharge status: Home / Self Care | Source: Ambulatory Visit | Attending: Internal Medicine | Admitting: Internal Medicine

## 2023-08-07 DIAGNOSIS — M545 Low back pain, unspecified: Secondary | ICD-10-CM

## 2023-09-03 ENCOUNTER — Ambulatory Visit
Admission: RE | Admit: 2023-09-03 | Discharge: 2023-09-03 | Disposition: A | Discharge status: Home / Self Care | Source: Ambulatory Visit | Attending: Internal Medicine | Admitting: Internal Medicine

## 2023-09-03 DIAGNOSIS — Z1231 Encounter for screening mammogram for malignant neoplasm of breast: Secondary | ICD-10-CM
